# Patient Record
Sex: Male | Born: 2000
Health system: Southern US, Community
[De-identification: ages and names within clinical notes are randomized; demographics above are authoritative.]

## PROBLEM LIST (undated history)

## (undated) HISTORY — PX: OTHER SURGICAL HISTORY: SHX169

---
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
## 2018-02-17 ENCOUNTER — Emergency Department (HOSPITAL_COMMUNITY): Payer: Medicaid Other

## 2018-02-17 ENCOUNTER — Other Ambulatory Visit: Payer: Self-pay

## 2018-02-17 ENCOUNTER — Encounter (HOSPITAL_COMMUNITY): Payer: Self-pay | Admitting: *Deleted

## 2018-02-17 ENCOUNTER — Emergency Department (HOSPITAL_COMMUNITY)
Admission: EM | Admit: 2018-02-17 | Discharge: 2018-02-17 | Disposition: A | Discharge status: Home / Self Care | Payer: Medicaid Other | Attending: Emergency Medicine | Admitting: Emergency Medicine

## 2018-02-17 DIAGNOSIS — Y9389 Activity, other specified: Secondary | ICD-10-CM | POA: Insufficient documentation

## 2018-02-17 DIAGNOSIS — S9031XA Contusion of right foot, initial encounter: Secondary | ICD-10-CM

## 2018-02-17 DIAGNOSIS — W2209XA Striking against other stationary object, initial encounter: Secondary | ICD-10-CM | POA: Diagnosis not present

## 2018-02-17 DIAGNOSIS — Y9289 Other specified places as the place of occurrence of the external cause: Secondary | ICD-10-CM | POA: Insufficient documentation

## 2018-02-17 DIAGNOSIS — Y990 Civilian activity done for income or pay: Secondary | ICD-10-CM | POA: Diagnosis not present

## 2018-02-17 DIAGNOSIS — S99921A Unspecified injury of right foot, initial encounter: Secondary | ICD-10-CM | POA: Diagnosis present

## 2018-02-17 IMAGING — DX DG FOOT COMPLETE 3+V*R*
3 series · 3 of 3 positions shown · non-contrast
Comparison: None.

CLINICAL DATA: Pain and swelling after trauma.

EXAM:
RIGHT FOOT COMPLETE - 3+ VIEW

[foot ap]
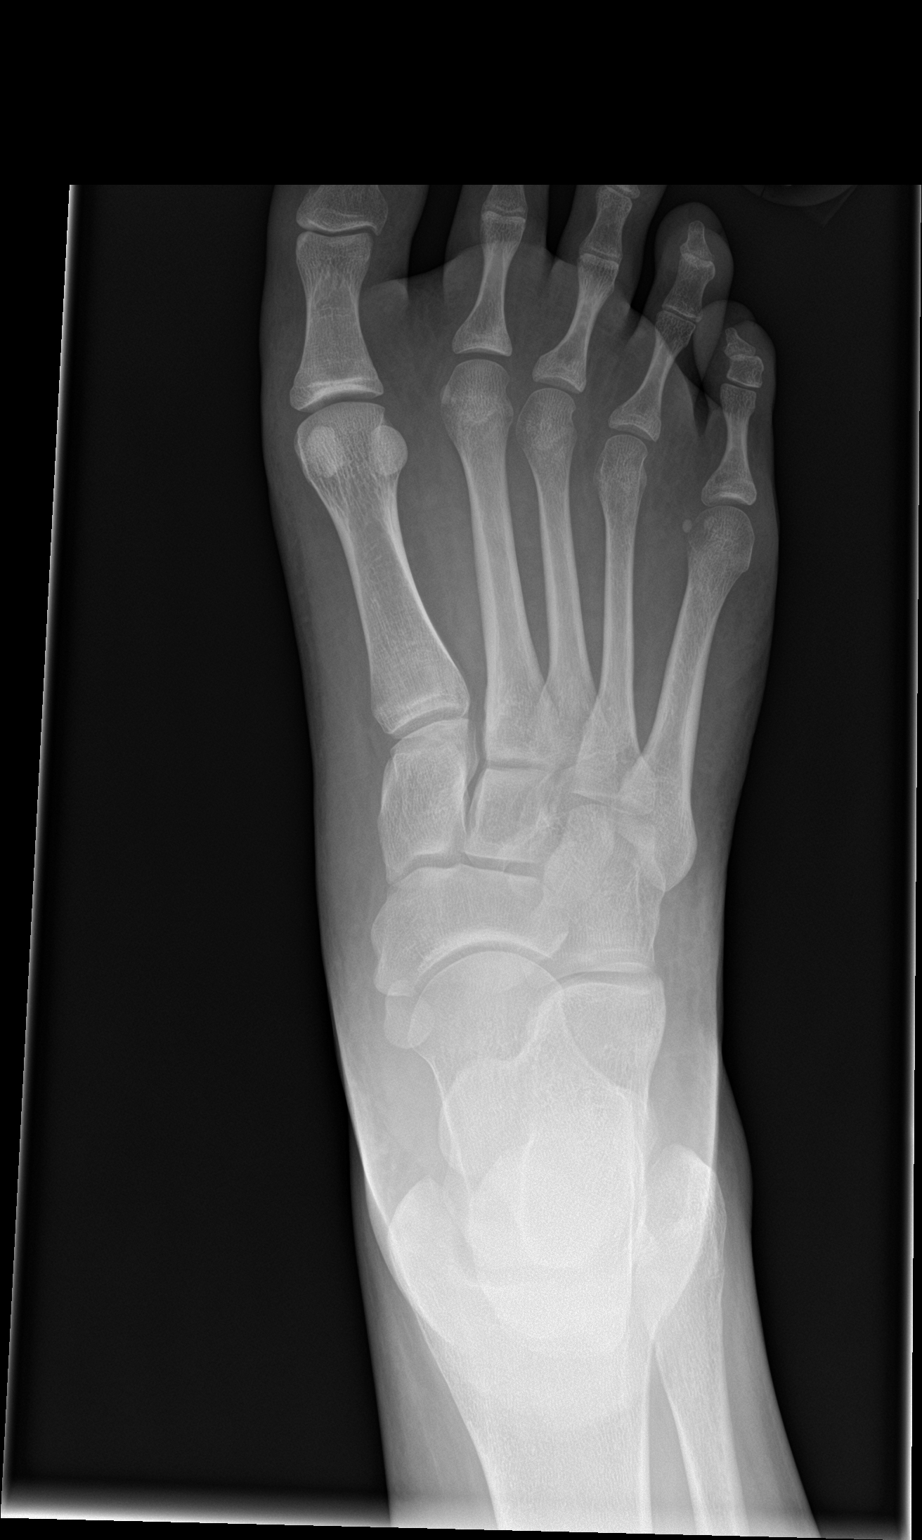

[foot obl]
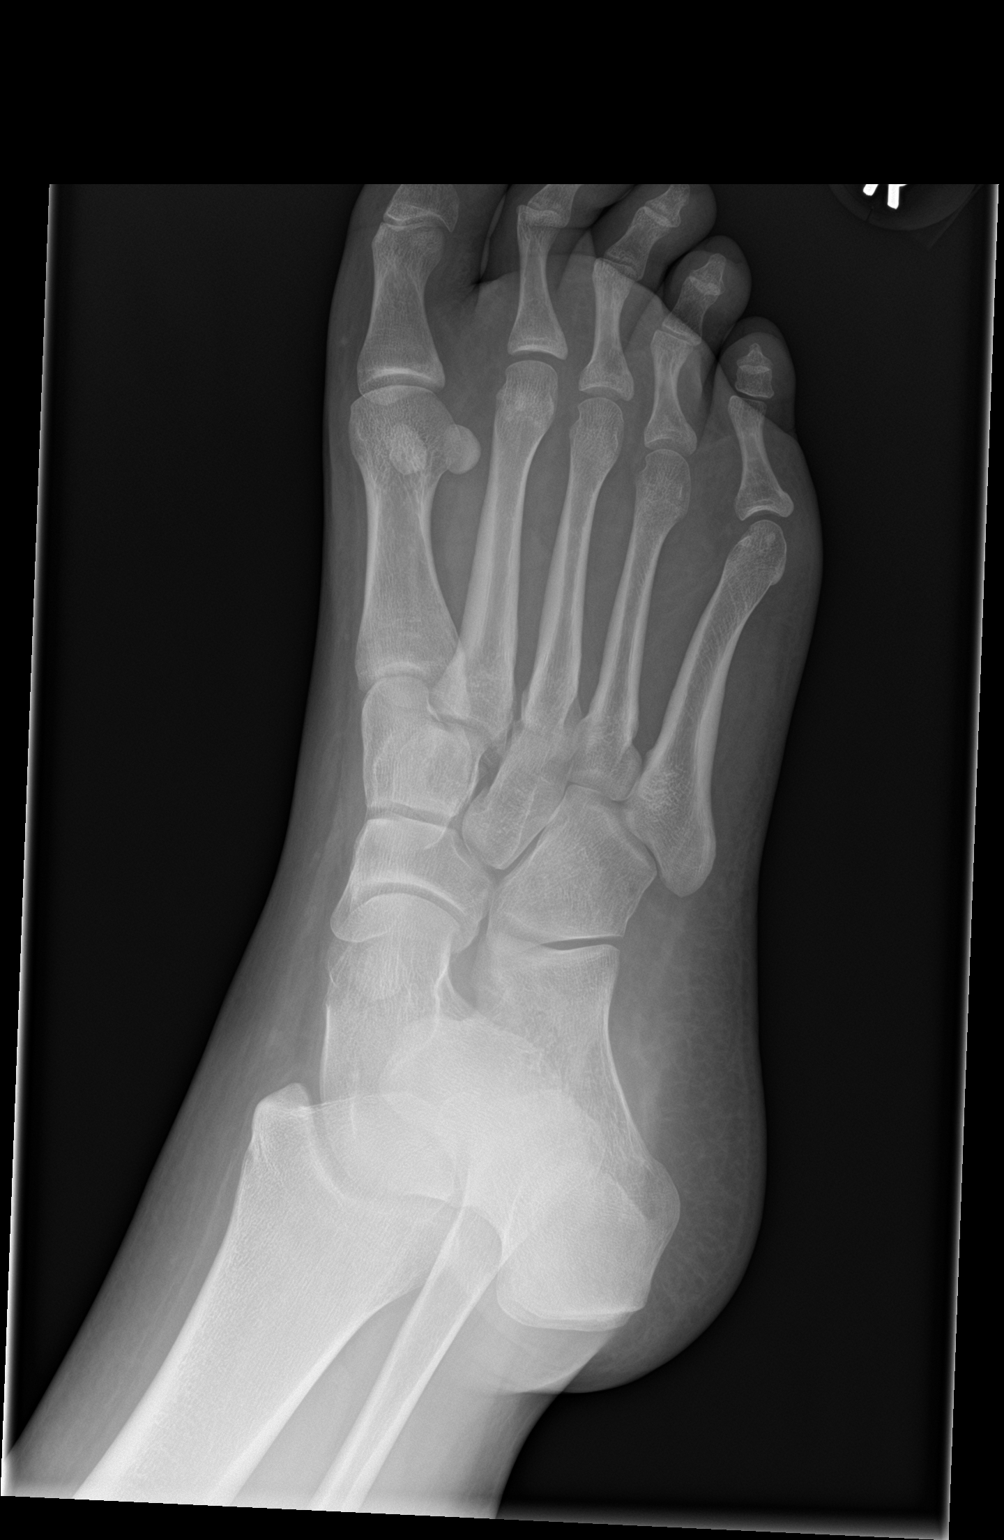

[foot lat]
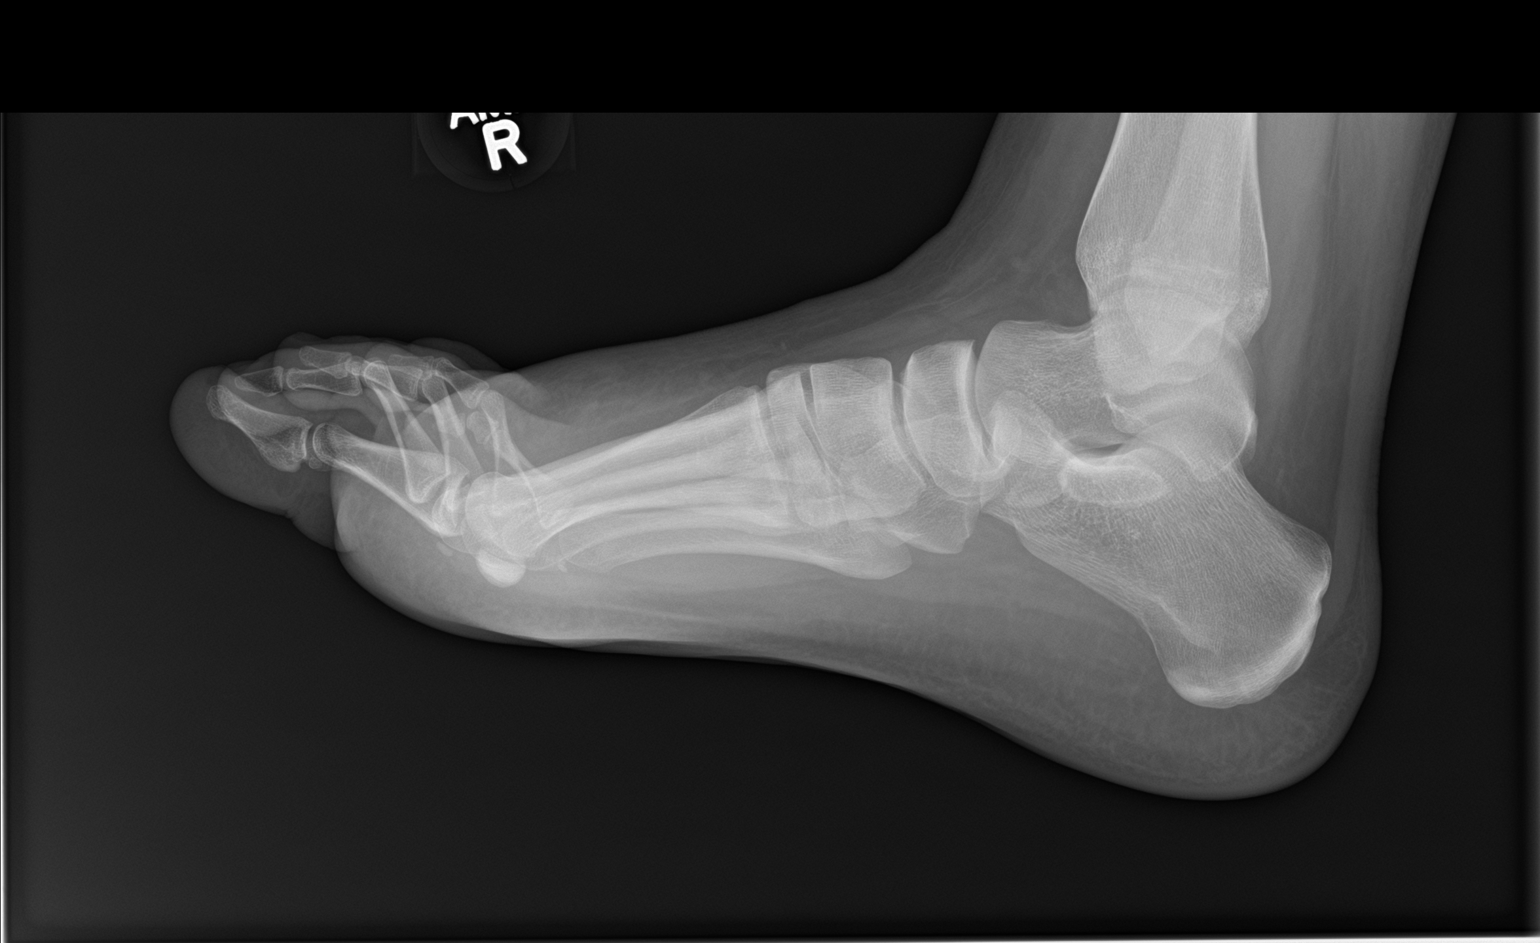

[3 of 3 positions shown; findings below may reference images not displayed]

FINDINGS: There is no evidence of fracture or dislocation. There is no
evidence of arthropathy or other focal bone abnormality. Soft
tissues are unremarkable.
IMPRESSION: Negative.

## 2018-02-17 NOTE — Discharge Instructions (Addendum)
Return if any problems.

## 2018-02-17 NOTE — ED Triage Notes (Signed)
Patient was at work and accidentally kicked a pallet, patient was wearing steel toe boots, injuring right foot visible bruising noted to top of right foot.

## 2018-02-18 NOTE — ED Provider Notes (Signed)
  [facility] EMERGENCY DEPARTMENT Provider Note   CSN: [id number] Arrival date & time: 02/17/18  1120     History   Chief Complaint Chief Complaint  Patient presents with  . Foot Pain    HPI Travis Griffin is a 17 y.o. male.  The history is provided by the patient. No language interpreter was used.  Foot Pain  This is a new problem. The current episode started more than 2 days ago. The problem occurs constantly. The problem has not changed since onset.Nothing aggravates the symptoms. Nothing relieves the symptoms. He has tried nothing for the symptoms. The treatment provided no relief.  Pt complains of soreness in his foot after kicking.  No past medical history on file.  There are no active problems to display for this patient.         Home Medications    Prior to Admission medications   Not on File    Family History No family history on file.  Social History Social History   Tobacco Use  . Smoking status: Never Smoker  . Smokeless tobacco: Never Used  Substance Use Topics  . Alcohol use: Not on file  . Drug use: Not on file     Allergies   Penicillins   Review of Systems Review of Systems  All other systems reviewed and are negative.    Physical Exam Updated Vital Signs BP 132/65 (BP Location: Left Arm)   Pulse 62   Temp 98.9 F (37.2 C) (Oral)   Resp 16   Ht 5\' 11"  (1.803 m)   Wt 108.9 kg (240 lb)   SpO2 100%   BMI 33.47 kg/m   Physical Exam  Constitutional: He appears well-developed and well-nourished.  HENT:  Head: Normocephalic.  Musculoskeletal: He exhibits tenderness. He exhibits no deformity.  Neurological: He is alert.  Skin: Skin is warm.  Psychiatric: He has a normal mood and affect.  Nursing note and vitals reviewed.    ED Treatments / Results  Labs (all labs ordered are listed, but only abnormal results are displayed) Labs Reviewed - No data to display  EKG None  Radiology Dg Foot Complete  Right  Result Date: 02/17/2018 CLINICAL DATA:  Pain and swelling after trauma. EXAM: RIGHT FOOT COMPLETE - 3+ VIEW COMPARISON:  None. FINDINGS: There is no evidence of fracture or dislocation. There is no evidence of arthropathy or other focal bone abnormality. Soft tissues are unremarkable. IMPRESSION: Negative. Electronically Signed   By: Dorise Bullion III M.D   On: 02/17/2018 12:25    Procedures Procedures (including critical care time)  Medications Ordered in ED Medications - No data to display   Initial Impression / Assessment and Plan / ED Course  I have reviewed the triage vital signs and the nursing notes.  Pertinent labs & imaging results that were available during my care of the patient were reviewed by me and considered in my medical decision making (see chart for details).       Final Clinical Impressions(s) / ED Diagnoses   Final diagnoses:  Contusion of right foot, initial encounter    ED Discharge Orders    None    ace wrap   Fransico Meadow, PA-C 02/18/18 7124    Margette Fast, MD 02/18/18 [phone]

## 2018-04-24 ENCOUNTER — Encounter (HOSPITAL_COMMUNITY): Payer: Self-pay | Admitting: Emergency Medicine

## 2018-04-24 ENCOUNTER — Other Ambulatory Visit: Payer: Self-pay

## 2018-04-24 ENCOUNTER — Emergency Department (HOSPITAL_COMMUNITY)
Admission: EM | Admit: 2018-04-24 | Discharge: 2018-04-24 | Disposition: A | Discharge status: Home / Self Care | Payer: Medicaid Other | Attending: Emergency Medicine | Admitting: Emergency Medicine

## 2018-04-24 DIAGNOSIS — R112 Nausea with vomiting, unspecified: Secondary | ICD-10-CM | POA: Diagnosis not present

## 2018-04-24 DIAGNOSIS — R197 Diarrhea, unspecified: Secondary | ICD-10-CM | POA: Diagnosis not present

## 2018-04-24 DIAGNOSIS — R740 Nonspecific elevation of levels of transaminase and lactic acid dehydrogenase [LDH]: Secondary | ICD-10-CM | POA: Insufficient documentation

## 2018-04-24 DIAGNOSIS — R7401 Elevation of levels of liver transaminase levels: Secondary | ICD-10-CM

## 2018-04-24 LAB — COMPREHENSIVE METABOLIC PANEL
ALK PHOS: 43 U/L (ref 38–126)
ALT: 76 U/L — AB (ref 0–44)
AST: 77 U/L — AB (ref 15–41)
Albumin: 4.4 g/dL (ref 3.5–5.0)
Anion gap: 10 (ref 5–15)
BILIRUBIN TOTAL: 1.4 mg/dL — AB (ref 0.3–1.2)
BUN: 11 mg/dL (ref 6–20)
CALCIUM: 9.5 mg/dL (ref 8.9–10.3)
CHLORIDE: 103 mmol/L (ref 98–111)
CO2: 25 mmol/L (ref 22–32)
CREATININE: 0.78 mg/dL (ref 0.61–1.24)
GFR calc non Af Amer: >60 mL/min (ref >60)
Glucose, Bld: 96 mg/dL (ref 70–99)
Potassium: 3.7 mmol/L (ref 3.5–5.1)
Sodium: 138 mmol/L (ref 135–145)
TOTAL PROTEIN: 8.2 g/dL — AB (ref 6.5–8.1)

## 2018-04-24 LAB — URINALYSIS, ROUTINE W REFLEX MICROSCOPIC
Bilirubin Urine: NEGATIVE
GLUCOSE, UA: NEGATIVE mg/dL
Hgb urine dipstick: NEGATIVE
KETONES UR: NEGATIVE mg/dL
LEUKOCYTES UA: NEGATIVE
Nitrite: NEGATIVE
PROTEIN: NEGATIVE mg/dL
Specific Gravity, Urine: 1.021 (ref 1.005–1.030)
pH: 5 (ref 5.0–8.0)

## 2018-04-24 LAB — CBC
HCT: 41.8 % (ref 39.0–52.0)
Hemoglobin: 14.1 g/dL (ref 13.0–17.0)
MCH: 29 pg (ref 26.0–34.0)
MCHC: 33.7 g/dL (ref 30.0–36.0)
MCV: 86 fL (ref 78.0–100.0)
PLATELETS: 304 10*3/uL (ref 150–400)
RBC: 4.86 MIL/uL (ref 4.22–5.81)
RDW: 13 % (ref 11.5–15.5)
WBC: 8.5 10*3/uL (ref 4.0–10.5)

## 2018-04-24 LAB — LIPASE, BLOOD: Lipase: 23 U/L (ref 11–51)

## 2018-04-24 MED ORDER — ONDANSETRON HCL 4 MG PO TABS: 4.0000 mg | ORAL_TABLET | Freq: Three times a day (TID) | ORAL | 0 refills | Status: AC | PRN

## 2018-04-24 NOTE — ED Provider Notes (Signed)
[facility] EMERGENCY DEPARTMENT Provider Note   CSN: [id number] Arrival date & time: 04/24/18  1344     History   Chief Complaint Chief Complaint  Patient presents with  . Abdominal Pain    HPI Travis Griffin is a 17 y.o. male.  17 y.o male with no PMH presents to the ED complaining of nausea, lightheadedness and blurry vision since this morning.Patient was seen at [facility] and diagnosed with viral gastroenteritis and prescribed lamotol. Patient states today he was at work when he began to feel lightheaded and nauseated.Patient states he has not been able to eat much lately, he states he's eaten a [place] sandwich and a chicken biscuit in the past two days. Patient states he experienced discomfort when "my food hits my stomach". Patient denies any vomiting, diarrhea, abdominal pain or chest pain/shortness of breath.      History reviewed. No pertinent past medical history.  There are no active problems to display for this patient.   History reviewed. No pertinent surgical history.      Home Medications    Prior to Admission medications   Medication Sig Start Date End Date Taking? Authorizing Provider  diphenoxylate-atropine (LOMOTIL) 2.5-0.025 MG tablet Take 2 tablets by mouth daily. 04/21/18  Yes [provider]  naproxen (NAPROSYN) 250 MG tablet Take 2 mg by mouth daily as needed for moderate pain or headache.   Yes [provider]  ondansetron (ZOFRAN) 4 MG tablet Take 1 tablet (4 mg total) by mouth every 8 (eight) hours as needed for up to 7 days for nausea or vomiting. 04/24/18 05/01/18  Janeece Fitting, PA-C    Family History No family history on file.  Social History Social History   Tobacco Use  . Smoking status: Never Smoker  . Smokeless tobacco: Never Used  Substance Use Topics  . Alcohol use: Never    Frequency: Never  . Drug use: Never     Allergies   Milk-related compounds and Penicillins   Review of Systems Review of Systems    Constitutional: Negative for chills and fever.  HENT: Negative for sore throat.   Eyes: Negative for photophobia.  Respiratory: Negative for chest tightness and shortness of breath.   Cardiovascular: Negative for chest pain and palpitations.  Gastrointestinal: Positive for nausea. Negative for abdominal pain (discomfort and pain upon palpation), diarrhea and vomiting.  Genitourinary: Negative for dysuria, flank pain and hematuria.  Musculoskeletal: Negative for back pain.  Neurological: Positive for light-headedness.  All other systems reviewed and are negative.    Physical Exam Updated Vital Signs BP 137/88 (BP Location: Right Arm)   Pulse 82   Temp 98.8 F (37.1 C) (Oral)   Resp 20   Ht 5\' 11"  (1.803 m)   Wt 113.4 kg (250 lb)   BMI 34.87 kg/m   Physical Exam  Constitutional: He is oriented to person, place, and time. He appears well-developed and well-nourished.  HENT:  Head: Normocephalic and atraumatic.  Mouth/Throat: Oropharynx is clear and moist.  Eyes: Pupils are equal, round, and reactive to light. No scleral icterus. Right pupil is reactive. Left pupil is reactive. Pupils are equal.  Neck: Normal range of motion.  Cardiovascular: Normal heart sounds.  Pulmonary/Chest: Effort normal and breath sounds normal. He has no wheezes. He exhibits no tenderness.  Abdominal: Soft. He exhibits no distension. Bowel sounds are decreased. There is tenderness in the right upper quadrant. There is no guarding, no CVA tenderness and no tenderness at McBurney's point.  Musculoskeletal:  He exhibits no tenderness or deformity.  Neurological: He is alert and oriented to person, place, and time. He has normal strength. No cranial nerve deficit.  Skin: Skin is warm and dry.  Nursing note and vitals reviewed.    ED Treatments / Results  Labs (all labs ordered are listed, but only abnormal results are displayed) Labs Reviewed  COMPREHENSIVE METABOLIC PANEL - Abnormal; Notable for the  following components:      Result Value   Total Protein 8.2 (*)    AST 77 (*)    ALT 76 (*)    Total Bilirubin 1.4 (*)    All other components within normal limits  URINALYSIS, ROUTINE W REFLEX MICROSCOPIC - Abnormal; Notable for the following components:   APPearance CLOUDY (*)    All other components within normal limits  LIPASE, BLOOD  CBC  HEPATITIS PANEL, ACUTE    EKG None  Radiology No results found.  Procedures Procedures (including critical care time)  Medications Ordered in ED Medications - No data to display   Initial Impression / Assessment and Plan / ED Course  I have reviewed the triage vital signs and the nursing notes.  Pertinent labs & imaging results that were available during my care of the patient were reviewed by me and considered in my medical decision making (see chart for details).    Patient presents to the ED complaining of nausea lightheadedness and blurry vision.Patient was seen at [facility] Tuesday and diagnosed with a viral gastroenteritis. Patient was prescribed lomotil and states the diarrhea has stopped.   Upon examination patient states he has some tenderness on the RUQ. Lab results showed: elevated liver enzymes.I have added an acute hepatitis panel and advised patient to follow up with PCP. Returned precautions provided to patient. Dr. Sabra Heck has discussed and seeing this patient with me. At this time patient is stable for discharge.   Final Clinical Impressions(s) / ED Diagnoses   Final diagnoses:  Transaminitis  Nausea vomiting and diarrhea    ED Discharge Orders        Ordered    ondansetron (ZOFRAN) 4 MG tablet  Every 8 hours PRN     04/24/18 1522       Janeece Fitting, PA-C 04/24/18 1533    Noemi Chapel, MD 04/25/18 [phone]

## 2018-04-24 NOTE — ED Triage Notes (Signed)
Pt c/o diarrhea, abdominal cramping, and lightheadedness that began Monday. Pt went to UC and diagnosed with GI virus. Pt given lomotil for diarrhea. Symptoms improved until today when he went back to work. Pt had to leave early due to generalized weakness, nausea, and lightheadedness.

## 2018-04-24 NOTE — Discharge Instructions (Addendum)
Please follow up with PCP on hepatitis panel. Please continue to hydrate.I have prescribed nausea medication, please take as needed.

## 2018-04-24 NOTE — ED Provider Notes (Signed)
Patient is a slightly overweight 17 year old male, very healthy-appearing, no significant chronic medical history, presents after approximately 5 days of nausea vomiting and diarrhea.  His last meal was 2 days ago, he has had very little since then, was getting lightheaded with standing at work.  On exam he has a soft nontender abdomen, bowel sounds are decreased, no distention, no edema, normal heart and lung sounds.  The patient is awake alert, he is aware that he needs to drink more fluids, I will encourage him to have 2 days of rest and oral hydration at home.    He will be given nausea medication for home, he will need to follow-up the hepatitis panel at home.  Pt agreeable.  Medical screening examination/treatment/procedure(s) were conducted as a shared visit with non-physician practitioner(s) and myself.  I personally evaluated the patient during the encounter.  Clinical Impression:   Final diagnoses:  Transaminitis  Nausea vomiting and diarrhea         Noemi Chapel, MD 04/25/18 [phone]

## 2018-04-25 LAB — HEPATITIS PANEL, ACUTE
HEP B C IGM: NEGATIVE
HEP B S AG: NEGATIVE
Hep A IgM: NEGATIVE

## 2018-04-29 NOTE — ED Notes (Signed)
Pt called requesting results of blood work.  Verified pt and consulted with Dr. Thurnell Garbe.  Notified pt hepatitis panel was negative but needed to follow up with primary doctor.  Pt verbalized understanding.

## 2018-05-10 ENCOUNTER — Emergency Department (HOSPITAL_COMMUNITY)
Admission: EM | Admit: 2018-05-10 | Discharge: 2018-05-10 | Disposition: A | Discharge status: Home / Self Care | Payer: Medicaid Other | Attending: Emergency Medicine | Admitting: Emergency Medicine

## 2018-05-10 ENCOUNTER — Encounter (HOSPITAL_COMMUNITY): Payer: Self-pay | Admitting: Emergency Medicine

## 2018-05-10 ENCOUNTER — Other Ambulatory Visit: Payer: Self-pay

## 2018-05-10 DIAGNOSIS — Y998 Other external cause status: Secondary | ICD-10-CM | POA: Insufficient documentation

## 2018-05-10 DIAGNOSIS — Y9389 Activity, other specified: Secondary | ICD-10-CM | POA: Diagnosis not present

## 2018-05-10 DIAGNOSIS — Y929 Unspecified place or not applicable: Secondary | ICD-10-CM | POA: Diagnosis not present

## 2018-05-10 DIAGNOSIS — W260XXA Contact with knife, initial encounter: Secondary | ICD-10-CM | POA: Insufficient documentation

## 2018-05-10 DIAGNOSIS — S6992XA Unspecified injury of left wrist, hand and finger(s), initial encounter: Secondary | ICD-10-CM | POA: Diagnosis present

## 2018-05-10 DIAGNOSIS — S61012A Laceration without foreign body of left thumb without damage to nail, initial encounter: Secondary | ICD-10-CM | POA: Diagnosis not present

## 2018-05-10 MED ORDER — BACITRACIN ZINC 500 UNIT/GM EX OINT
TOPICAL_OINTMENT | CUTANEOUS | Status: AC
  Administered 2018-05-10: 1 via TOPICAL
  Filled 2018-05-10: qty 0.9

## 2018-05-10 MED ORDER — BACITRACIN ZINC 500 UNIT/GM EX OINT
1.0000 "application " | TOPICAL_OINTMENT | Freq: Two times a day (BID) | CUTANEOUS | Status: DC
  Administered 2018-05-10: 1 via TOPICAL

## 2018-05-10 MED ORDER — IBUPROFEN 400 MG PO TABS: 400.0000 mg | ORAL_TABLET | Freq: Four times a day (QID) | ORAL | 0 refills | Status: DC | PRN

## 2018-05-10 MED ORDER — LIDOCAINE-EPINEPHRINE (PF) 1 %-1:200000 IJ SOLN
INTRAMUSCULAR | Status: AC
  Administered 2018-05-10: 30 mL
  Filled 2018-05-10: qty 30

## 2018-05-10 NOTE — ED Notes (Signed)
Left hand, medial thumb laceration, still bleeding, sub Q tissue visible. Pressure dressing applied--ABD pad applied and wrapped tightly with Coban. Pt instructed to elevate.

## 2018-05-10 NOTE — Discharge Instructions (Signed)

## 2018-05-10 NOTE — ED Provider Notes (Signed)
[facility] EMERGENCY DEPARTMENT Provider Note   CSN: [id number] Arrival date & time: 05/10/18  1946     History   Chief Complaint Chief Complaint  Patient presents with  . Laceration    HPI Travis Griffin is a 17 y.o. male.  HPI  Otherwise healthy 17 year old male presents with acute onset of a laceration to his left thumb.  This occurred just prior to arrival when he was trying to fix his pocket knife, it slipped causing a straight incised wound of his left thumb with excessive amounts of bleeding.  He has pain which is worse with palpation, persistent, mild, not associated with lightheadedness or any numbness.  He believes that he is up-to-date on tetanus.  History reviewed. No pertinent past medical history.  There are no active problems to display for this patient.   History reviewed. No pertinent surgical history.      Home Medications    Prior to Admission medications   Medication Sig Start Date End Date Taking? Authorizing Provider  ibuprofen (ADVIL,MOTRIN) 400 MG tablet Take 1 tablet (400 mg total) by mouth every 6 (six) hours as needed. 05/10/18   Noemi Chapel, MD    Family History No family history on file.  Social History Social History   Tobacco Use  . Smoking status: Never Smoker  . Smokeless tobacco: Never Used  Substance Use Topics  . Alcohol use: Never    Frequency: Never  . Drug use: Never     Allergies   Milk-related compounds and Penicillins   Review of Systems Review of Systems  Constitutional: Negative for fever.  Gastrointestinal: Negative for vomiting.  Skin: Positive for wound.       Laceration  Neurological: Negative for weakness and numbness.     Physical Exam Updated Vital Signs BP 122/79 (BP Location: Right Arm)   Pulse 64   Temp 98.4 F (36.9 C) (Oral)   Resp 18   Ht 5\' 11"  (1.803 m)   Wt 108.9 kg (240 lb)   SpO2 98%   BMI 33.47 kg/m   Physical Exam  Constitutional: He appears well-developed and  well-nourished. No distress.  HENT:  Head: Normocephalic.  Eyes: Conjunctivae are normal. No scleral icterus.  Cardiovascular: Normal rate and regular rhythm.  Pulmonary/Chest: Effort normal and breath sounds normal.  Musculoskeletal: Normal range of motion. He exhibits tenderness ( ttp over the laceration site). He exhibits no edema.  Neurological: He is alert. Coordination normal.  Sensation and motor intact  Skin: Skin is warm and dry. He is not diaphoretic.  Laceration located on left thumb  the Laceration is linear shaped The depth is 2 mm The length is 3 cm     ED Treatments / Results  Labs (all labs ordered are listed, but only abnormal results are displayed) Labs Reviewed - No data to display  EKG None  Radiology No results found.  Procedures .Marland KitchenLaceration Repair Date/Time: 05/10/2018 8:20 PM Performed by: Noemi Chapel, MD Authorized by: Noemi Chapel, MD   Consent:    Consent obtained:  Verbal   Consent given by:  Patient   Risks discussed:  Infection, pain, need for additional repair, poor cosmetic result and poor wound healing   Alternatives discussed:  No treatment and delayed treatment Anesthesia (see MAR for exact dosages):    Anesthesia method:  Local infiltration   Local anesthetic:  Lidocaine 1% WITH epi Laceration details:    Location:  Finger   Finger location:  L thumb   Length (  cm):  3   Depth (mm):  2 Repair type:    Repair type:  Simple Pre-procedure details:    Preparation:  Patient was prepped and draped in usual sterile fashion and imaging obtained to evaluate for foreign bodies (The wound was explored, there was no signs of foreign body, no violation of the joint capsule, no exposed bone, no exposed tendons, normal range of motion in all directions against resistance) Exploration:    Hemostasis achieved with:  Direct pressure   Wound exploration: wound explored through full range of motion and entire depth of wound probed and visualized       Wound extent: no fascia violation noted, no foreign bodies/material noted, no muscle damage noted, no nerve damage noted, no tendon damage noted, no underlying fracture noted and no vascular damage noted     Contaminated: no   Treatment:    Area cleansed with:  Betadine   Amount of cleaning:  Standard   Irrigation solution:  Sterile saline   Irrigation volume:  1000 mL   Irrigation method:  Syringe Skin repair:    Repair method:  Sutures   Suture size:  4-0   Suture material:  Prolene   Suture technique:  Simple interrupted   Number of sutures:  4 Approximation:    Approximation:  Close Post-procedure details:    Dressing:  Antibiotic ointment and sterile dressing   Patient tolerance of procedure:  Tolerated well, no immediate complications Comments:         (including critical care time)  Medications Ordered in ED Medications  lidocaine-EPINEPHrine (XYLOCAINE-EPINEPHrine) 1 %-1:200000 (PF) injection (has no administration in time range)  bacitracin ointment 1 application (has no administration in time range)     Initial Impression / Assessment and Plan / ED Course  I have reviewed the triage vital signs and the nursing notes.  Pertinent labs & imaging results that were available during my care of the patient were reviewed by me and considered in my medical decision making (see chart for details).     Wound repaired, tetanus up-to-date, stable for discharge, dressing placed, static finger splint placed, patient given instructions on return, expressed understanding.  The patient is neurovascularly intact with normal flexor and extensor tendon function, normal sensation to the tip of the thumb  Final Clinical Impressions(s) / ED Diagnoses   Final diagnoses:  Laceration of left thumb without foreign body without damage to nail, initial encounter    ED Discharge Orders        Ordered    ibuprofen (ADVIL,MOTRIN) 400 MG tablet  Every 6 hours PRN     05/10/18 2017        Noemi Chapel, MD 05/10/18 2021

## 2018-05-10 NOTE — ED Triage Notes (Signed)
Pt states he was "messing with " a pocket knife when he cut his L thumb. Bleeding not controlled at this time. Pressure bandage applied.

## 2018-05-10 NOTE — ED Notes (Signed)
EDP in room  

## 2019-09-08 ENCOUNTER — Other Ambulatory Visit: Payer: Self-pay

## 2019-09-08 DIAGNOSIS — Z20822 Contact with and (suspected) exposure to covid-19: Secondary | ICD-10-CM

## 2019-09-10 LAB — NOVEL CORONAVIRUS, NAA: SARS-CoV-2, NAA: NOT DETECTED

## 2020-08-25 DIAGNOSIS — H5213 Myopia, bilateral: Secondary | ICD-10-CM | POA: Diagnosis not present

## 2020-08-25 DIAGNOSIS — H52223 Regular astigmatism, bilateral: Secondary | ICD-10-CM | POA: Diagnosis not present

## 2020-10-21 ENCOUNTER — Other Ambulatory Visit: Payer: Self-pay

## 2020-10-21 ENCOUNTER — Ambulatory Visit
Admission: EM | Admit: 2020-10-21 | Discharge: 2020-10-21 | Disposition: A | Discharge status: Home / Self Care | Payer: Medicaid Other | Attending: Family Medicine | Admitting: Family Medicine

## 2020-10-21 DIAGNOSIS — R0602 Shortness of breath: Secondary | ICD-10-CM

## 2020-10-21 DIAGNOSIS — J029 Acute pharyngitis, unspecified: Secondary | ICD-10-CM

## 2020-10-21 DIAGNOSIS — Z1152 Encounter for screening for COVID-19: Secondary | ICD-10-CM

## 2020-10-21 DIAGNOSIS — R059 Cough, unspecified: Secondary | ICD-10-CM

## 2020-10-21 DIAGNOSIS — J3489 Other specified disorders of nose and nasal sinuses: Secondary | ICD-10-CM

## 2020-10-21 DIAGNOSIS — R0981 Nasal congestion: Secondary | ICD-10-CM

## 2020-10-21 DIAGNOSIS — B349 Viral infection, unspecified: Secondary | ICD-10-CM

## 2020-10-21 MED ORDER — PROMETHAZINE-DM 6.25-15 MG/5ML PO SYRP: 5.0000 mL | ORAL_SOLUTION | Freq: Four times a day (QID) | ORAL | 0 refills | Status: DC | PRN

## 2020-10-21 NOTE — Discharge Instructions (Signed)
I have sent in cough syrup for you to take. This medication can make you sleepy. Do not drive while taking this medication.  Your COVID and Flu tests are pending.  You should self quarantine until the test results are back.    Take Tylenol or ibuprofen as needed for fever or discomfort.  Rest and keep yourself hydrated.    Follow-up with your primary care provider if your symptoms are not improving.

## 2020-10-21 NOTE — ED Provider Notes (Signed)
[facility]   [id number] 10/21/20 Arrival Time: 1209   CC: COVID symptoms  SUBJECTIVE: History from: patient.  Travis Griffin is a 20 y.o. male who presents with nasal congestion, sore throat and cough x 3 days. Denies sick exposure to COVID, flu or strep. Denies recent travel. Has negative history of Covid. Has not completed Covid vaccines. Has not taken OTC medications for this. Sore throat is worse with swallowing Denies previous symptoms in the past. Denies fever, chills, fatigue, sinus pain, rhinorrhea, SOB, wheezing, chest pain, nausea, changes in bowel or bladder habits.    ROS: As per HPI.  All other pertinent ROS negative.     History reviewed. No pertinent past medical history. History reviewed. No pertinent surgical history. Allergies  Allergen Reactions  . Amoxicillin   . Milk-Related Compounds Diarrhea    Stomach cramps with milk and ice-cream  . Penicillins Hives    Has patient had a PCN reaction causing immediate rash, facial/tongue/throat swelling, SOB or lightheadedness with hypotension: Unknown Has patient had a PCN reaction causing severe rash involving mucus membranes or skin necrosis: Unknown Has patient had a PCN reaction that required hospitalization: Unknown Has patient had a PCN reaction occurring within the last 10 years: Unknown If all of the above answers are "NO", then may proceed with Cephalosporin use.    No current facility-administered medications on file prior to encounter.   Current Outpatient Medications on File Prior to Encounter  Medication Sig Dispense Refill  . ibuprofen (ADVIL,MOTRIN) 400 MG tablet Take 1 tablet (400 mg total) by mouth every 6 (six) hours as needed. 30 tablet 0   Social History   Socioeconomic History  . Marital status: Single    Spouse name: Not on file  . Number of children: Not on file  . Years of education: Not on file  . Highest education level: Not on file  Occupational History  . Not on file   Tobacco Use  . Smoking status: Never Smoker  . Smokeless tobacco: Never Used  Vaping Use  . Vaping Use: Never used  Substance and Sexual Activity  . Alcohol use: Never  . Drug use: Never  . Sexual activity: Not on file  Other Topics Concern  . Not on file  Social History Narrative  . Not on file   Social Determinants of Health   Financial Resource Strain: Not on file  Food Insecurity: Not on file  Transportation Needs: Not on file  Physical Activity: Not on file  Stress: Not on file  Social Connections: Not on file  Intimate Partner Violence: Not on file   History reviewed. No pertinent family history.  OBJECTIVE:  Vitals:   10/21/20 1319  BP: 132/86  Pulse: 79  Resp: 16  Temp: 98.4 F (36.9 C)  TempSrc: Oral  SpO2: 97%     General appearance: alert; appears fatigued, but nontoxic; speaking in full sentences and tolerating own secretions HEENT: NCAT; Ears: EACs clear, TMs pearly gray; Eyes: PERRL.  EOM grossly intact. Sinuses: nontender; Nose: nares patent with clear rhinorrhea, Throat: oropharynx erythematous, cobblestoning present, tonsils non erythematous or enlarged, uvula midline  Neck: supple without LAD Lungs: unlabored respirations, symmetrical air entry; cough: absent; no respiratory distress; CTAB Heart: regular rate and rhythm.  Radial pulses 2+ symmetrical bilaterally Skin: warm and dry Psychological: alert and cooperative; normal mood and affect  LABS:  No results found for this or any previous visit (from the past 24 hour(s)).   ASSESSMENT & PLAN:  1. Viral illness   2. Encounter for screening for COVID-19   3. Sore throat   4. Rhinorrhea   5. Nasal congestion   6. Cough   7. SOB (shortness of breath)     Meds ordered this encounter  Medications  . promethazine-dextromethorphan (PROMETHAZINE-DM) 6.25-15 MG/5ML syrup    Sig: Take 5 mLs by mouth 4 (four) times daily as needed for cough.    Dispense:  118 mL    Refill:  0    Order  Specific Question:   Supervising Provider    Answer:   Chase Picket [[id number]]   Promethazine cough syrup prescribed Sedation precautions given Continue supportive care at home COVID and flu testing ordered.  It will take between 2-3 days for test results. Someone will contact you regarding abnormal results.   Work note provided Patient should remain in quarantine until they have received Covid results.  If negative you may resume normal activities (go back to work/school) while practicing hand hygiene, social distance, and mask wearing.  If positive, patient should remain in quarantine for at least 5 days from symptom onset AND greater than 72 hours after symptoms resolution (absence of fever without the use of fever-reducing medication and improvement in respiratory symptoms), whichever is longer Get plenty of rest and push fluids Use OTC zyrtec for nasal congestion, runny nose, and/or sore throat Use OTC flonase for nasal congestion and runny nose Use medications daily for symptom relief Use OTC medications like ibuprofen or tylenol as needed fever or pain Call or go to the ED if you have any new or worsening symptoms such as fever, worsening cough, shortness of breath, chest tightness, chest pain, turning blue, changes in mental status.  Reviewed expectations re: course of current medical issues. Questions answered. Outlined signs and symptoms indicating need for more acute intervention. Patient verbalized understanding. After Visit Summary given.         Faustino Congress, NP 10/23/20 2025

## 2020-10-21 NOTE — ED Triage Notes (Signed)
Pt presents with c/o sore throat , nasal congestion and cough

## 2020-10-24 LAB — COVID-19, FLU A+B NAA
Influenza A, NAA: NOT DETECTED
Influenza B, NAA: NOT DETECTED
SARS-CoV-2, NAA: NOT DETECTED

## 2020-11-17 DIAGNOSIS — J029 Acute pharyngitis, unspecified: Secondary | ICD-10-CM | POA: Diagnosis not present

## 2020-11-17 DIAGNOSIS — R0982 Postnasal drip: Secondary | ICD-10-CM | POA: Diagnosis not present

## 2020-11-17 DIAGNOSIS — J302 Other seasonal allergic rhinitis: Secondary | ICD-10-CM | POA: Diagnosis not present

## 2021-02-14 DIAGNOSIS — H539 Unspecified visual disturbance: Secondary | ICD-10-CM | POA: Diagnosis not present

## 2021-02-28 DIAGNOSIS — H469 Unspecified optic neuritis: Secondary | ICD-10-CM | POA: Diagnosis not present

## 2021-03-06 DIAGNOSIS — U071 COVID-19: Secondary | ICD-10-CM | POA: Diagnosis not present

## 2021-03-13 ENCOUNTER — Other Ambulatory Visit: Payer: Self-pay | Admitting: Ophthalmology

## 2021-03-13 DIAGNOSIS — H5340 Unspecified visual field defects: Secondary | ICD-10-CM

## 2021-03-13 DIAGNOSIS — H469 Unspecified optic neuritis: Secondary | ICD-10-CM

## 2021-04-06 ENCOUNTER — Other Ambulatory Visit: Payer: Self-pay

## 2021-04-06 ENCOUNTER — Ambulatory Visit
Admission: RE | Admit: 2021-04-06 | Discharge: 2021-04-06 | Disposition: A | Discharge status: Home / Self Care | Payer: BC Managed Care – PPO | Source: Ambulatory Visit | Attending: Ophthalmology | Admitting: Ophthalmology

## 2021-04-06 DIAGNOSIS — H469 Unspecified optic neuritis: Secondary | ICD-10-CM

## 2021-04-06 DIAGNOSIS — G9389 Other specified disorders of brain: Secondary | ICD-10-CM | POA: Diagnosis not present

## 2021-04-06 DIAGNOSIS — H538 Other visual disturbances: Secondary | ICD-10-CM | POA: Diagnosis not present

## 2021-04-06 DIAGNOSIS — H5340 Unspecified visual field defects: Secondary | ICD-10-CM

## 2021-04-06 IMAGING — MR MR HEAD WO/W CM
13 of 14 series · 38 of 48 positions shown · IV contrast (multihance)
Comparison: None.
COMPARISON: None.

Addendum:
CLINICAL DATA: 19-year-old male with visual defect. Right eye
blurred vision x5 years.

EXAM:
MRI HEAD WITHOUT AND WITH CONTRAST
TECHNIQUE: Multiplanar, multiecho pulse sequences of the brain and surrounding
structures were obtained without and with intravenous contrast.
CONTRAST:  20mL MULTIHANCE GADOBENATE DIMEGLUMINE 529 MG/ML IV SOLN

[Series 2: T1 · sagittal · 5.0mm · 0.46mm/px · 2 of 22 slices shown]
[im 1/22]
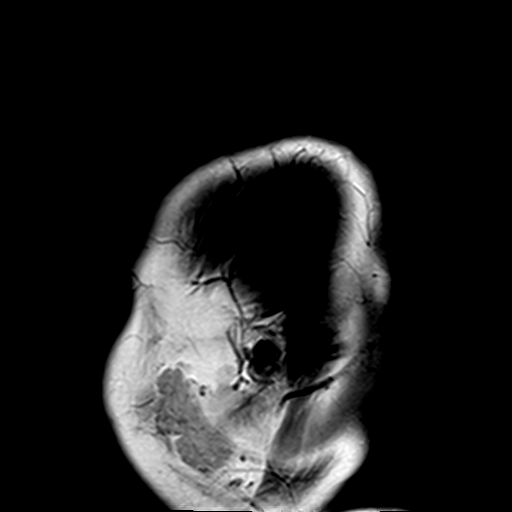
[im 22/22]
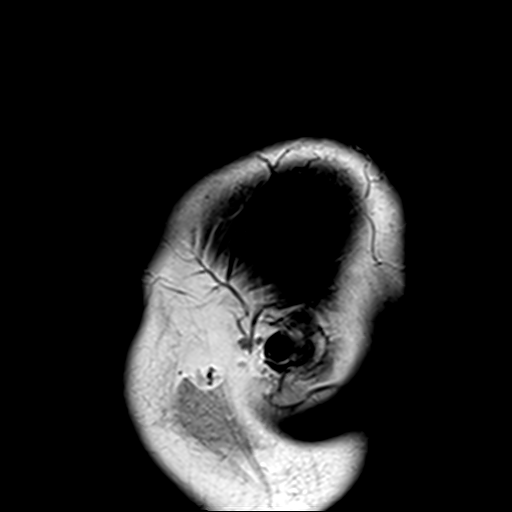

[Series 3: DWI · axial · 3.0mm · 1.80mm/px · z∈[-56,+91]mm · 6 of 99 slices shown (1 of 4)]
[im 1/99]
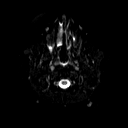
[im 20/99]
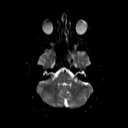
[im 40/99]
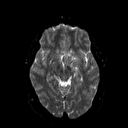
[im 59/99]
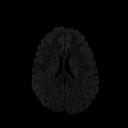
[im 79/99]
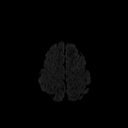
[im 99/99]
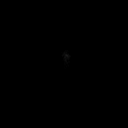

[Series 4: DWI · axial · 3.0mm · 1.80mm/px · z∈[-56,+91]mm · 3 of 50 slices shown (2 of 4)]
[im 1/50]
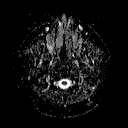
[im 25/50]
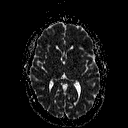
[im 50/50]
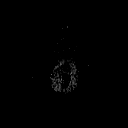

[Series 5: DWI · coronal · 5.0mm · 1.80mm/px · 4 of 72 slices shown (3 of 4)]
[im 1/72]
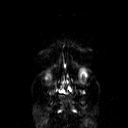
[im 24/72]
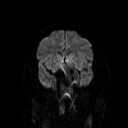
[im 48/72]
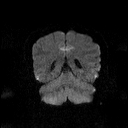
[im 72/72]
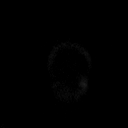

[Series 6: DWI · coronal · 5.0mm · 1.80mm/px · 2 of 36 slices shown (4 of 4)]
[im 1/36]
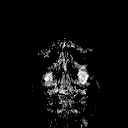
[im 36/36]
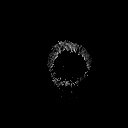

[Series 7: T2 · axial · 5.0mm · 0.60mm/px · 1 of 22 slices shown (1 of 2)]
[im 1/22]
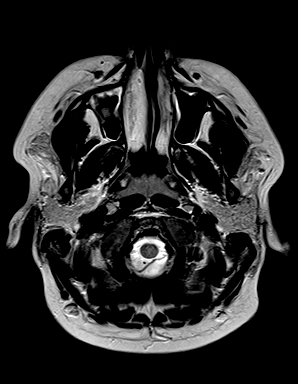

[Series 8: FLAIR · axial · 3.0mm · 0.45mm/px · z∈[-59,+90]mm · 2 of 33 slices shown]
[im 1/33]
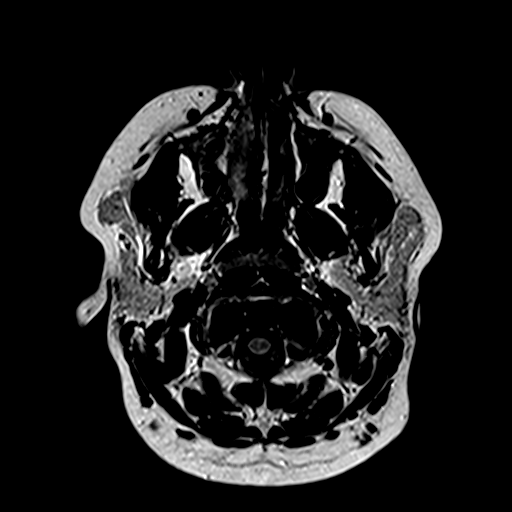
[im 33/33]
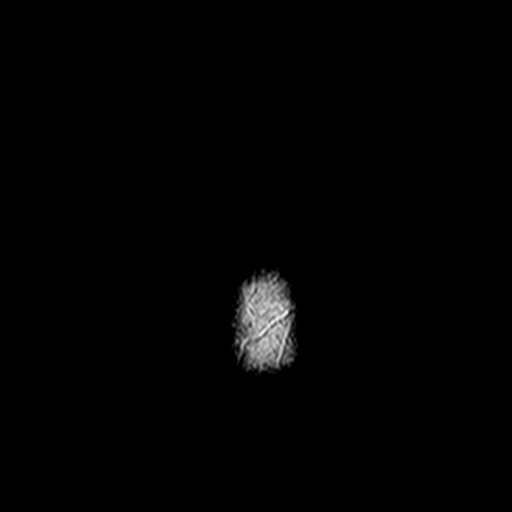

[Series 10: swi_images · axial · 4.0mm · 0.90mm/px · z∈[-65,+90]mm · 2 of 40 slices shown]
[im 1/40]
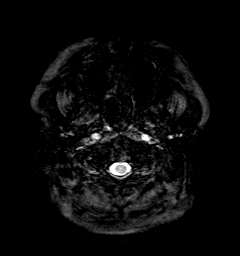
[im 40/40]
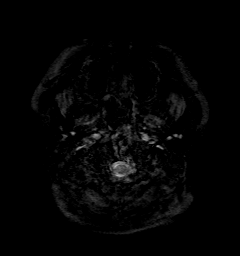

[Series 11: t1_mpr_tra · axial · 1.0mm · 0.75mm/px · z∈[-64,+95]mm · 8 of 160 slices shown]
[im 1/160]
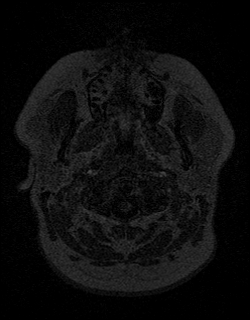
[im 18/160]
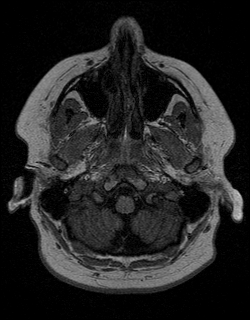
[im 54/160]
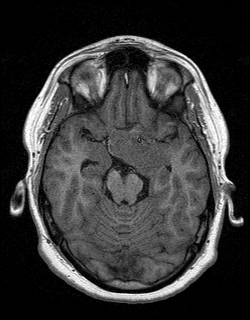
[im 71/160]
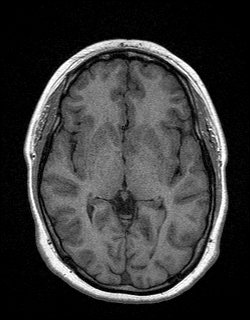
[im 89/160]
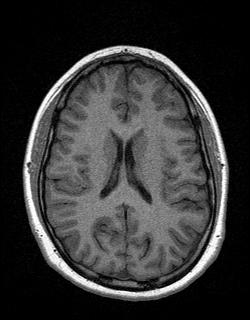
[im 107/160]
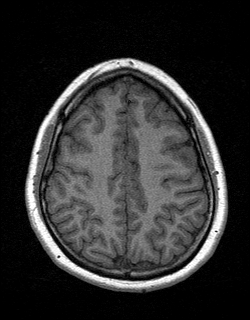
[im 142/160]
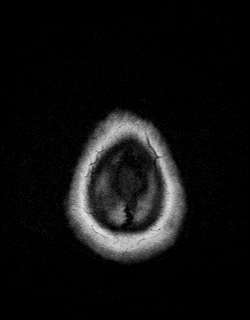
[im 160/160]
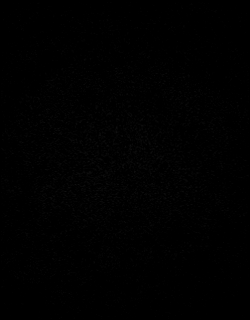

[Series 12: T2 · coronal · 5.0mm · 0.45mm/px · 2 of 28 slices shown (2 of 2)]
[im 1/28]
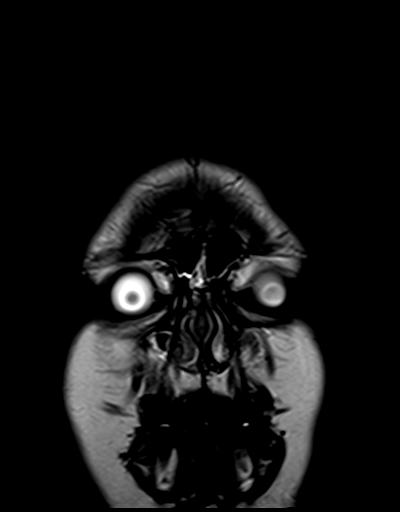
[im 28/28]
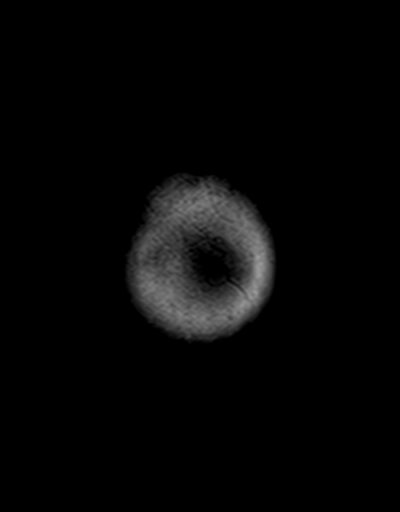

[Series 13: t1_mpr_tra post · axial · 1.0mm · 0.75mm/px · z∈[-64,+6]mm · 4 of 160 slices shown]
[im 1/160]
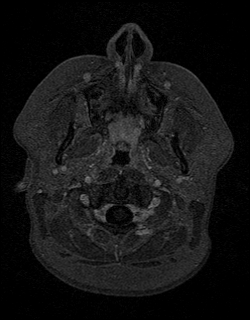
[im 18/160]
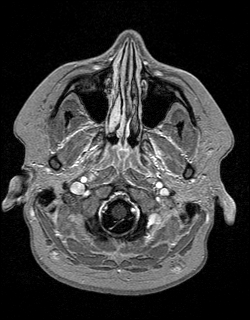
[im 54/160]
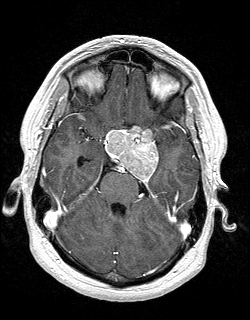
[im 71/160]
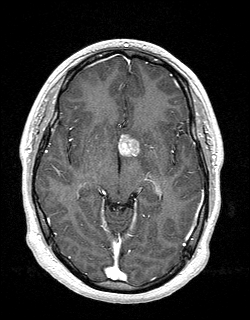

[Series 15: T1 post-contrast · sagittal · 5.0mm · 0.46mm/px · 1 of 22 slices shown (1 of 2)]
[im 1/22]
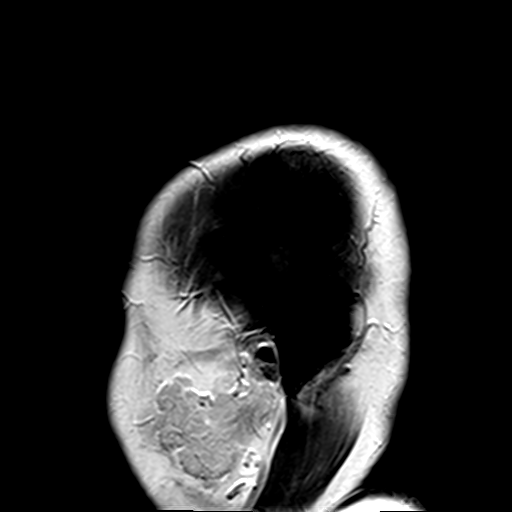

[Series 16: T1 post-contrast · coronal · 3.0mm · 0.33mm/px · 1 of 19 slices shown (2 of 2)]
[im 1/19]
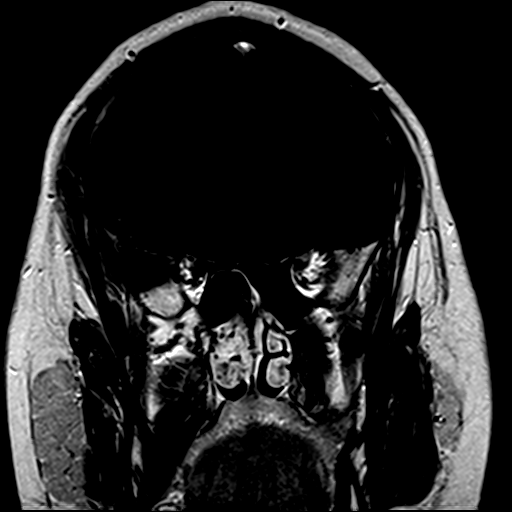

[38 of 48 positions shown; findings below may reference images not displayed]

FINDINGS: Brain: No normal pituitary parenchyma is identified. Instead there
is a large, lobulated and infiltrative skull base mass with
heterogeneous enhancement occupying the sella turcica, continuing
into the suprasellar cistern with mass effect on the undersurface of
the brain (series 15, image 12), completely infiltrating the left
cavernous sinus (series 16, image 11), extending into the posterior
sphenoid sinuses especially the left (series 13, image 32),
extending into the left orbital apex (series 13, image 44), and also
tracking posteriorly toward the left cerebellar tentorium (series
13, image 54). The mass completely engulfs the left ICA siphon which
appears to remain patent (series 15, image 13), and also abuts most
of the right ICA siphon. The mass the mass has a combination of
smooth and highly lobulated margins, and has smoothly remodeled the
left clivus as seen on series 2, image 12. There is partial
effacement of the left prepontine cistern (series 7, image 7). The
left Meckel's cave is partially infiltrated and/or compressed
(series 16, image 8). The mass also involves the left foramen
rotundum. All told, the lesion encompasses 50 by 44 by 49 mm (AP by
transverse by CC). The lesion has isointense diffusion. SWI is
limited for mineralization of the mass due to proximity to the skull
base.

Severe mass effect on the optic chiasm and proximal left optic
nerve, which cannot be delineated separate from the mass.

There is trace rightward midline shift but despite the mass effect
and multi spatial involvement no cerebral edema is identified. No
other abnormal intracranial enhancement is identified. No
superimposed restricted diffusion to suggest acute infarction. No
ventriculomegaly, extra-axial fluid collection or acute intracranial
hemorrhage. Cervicomedullary junction within normal limits. No
encephalomalacia or chronic cerebral blood products identified.

Vascular: Major intracranial vascular flow voids are preserved,
despite the large central skull base mass described above. There is
mild mass effect on the left ICA siphon and left ICA terminus. Aside
from the cavernous sinuses described above, the major dural venous
sinuses are enhancing and appear to be patent.

Skull and upper cervical spine: Background bone marrow signal is
within normal limits. There appears to be congenital incomplete
segmentation of the C2-C3 vertebrae, normal variant.

Sinuses/Orbits: Severe mass effect on the optic chiasm and left
optic nerve to the orbital apex. The right orbital apex is
relatively spared. Other intraorbital soft tissues appears symmetric
and normal.

There is mild maxillary and ethmoid sinus mucosal thickening. As
described above there is some tumor in the posterior sphenoid
sinuses, especially the left.

Other: Mastoid air cells remain clear. Visible internal auditory
structures appear normal. Negative visible scalp and face soft
tissues.
IMPRESSION: 1. Large, lobulated and smoothly infiltrating extra-axial mass of
the central skull base measures roughly 5 cm diameter, is eccentric
to the left, and could be a large Pituitary Macroadenoma or a skull
base Meningioma.
Severe involvement of the sella turcica, left cavernous sinus, left
ICA siphon, suprasellar cistern, and optic chiasm.
Early involvement of the left orbital apex and prepontine cistern,
as well as some tumor extension suspected into the posterior
sphenoid sinuses, more so the left.
Despite the mass effect, there is no cerebral edema.
2. Recommend Neurosurgery consultation.
3. Possible congenital incomplete segmentation of the C2-C3
vertebrae.

ADDENDUM:
Study discussed by telephone with Dr. POZISA on [DATE]
at [DT] hours.

*** End of Addendum ***
FINDINGS: Brain: No normal pituitary parenchyma is identified. Instead there
is a large, lobulated and infiltrative skull base mass with
heterogeneous enhancement occupying the sella turcica, continuing
into the suprasellar cistern with mass effect on the undersurface of
the brain (series 15, image 12), completely infiltrating the left
cavernous sinus (series 16, image 11), extending into the posterior
sphenoid sinuses especially the left (series 13, image 32),
extending into the left orbital apex (series 13, image 44), and also
tracking posteriorly toward the left cerebellar tentorium (series
13, image 54). The mass completely engulfs the left ICA siphon which
appears to remain patent (series 15, image 13), and also abuts most
of the right ICA siphon. The mass the mass has a combination of
smooth and highly lobulated margins, and has smoothly remodeled the
left clivus as seen on series 2, image 12. There is partial
effacement of the left prepontine cistern (series 7, image 7). The
left Meckel's cave is partially infiltrated and/or compressed
(series 16, image 8). The mass also involves the left foramen
rotundum. All told, the lesion encompasses 50 by 44 by 49 mm (AP by
transverse by CC). The lesion has isointense diffusion. SWI is
limited for mineralization of the mass due to proximity to the skull
base.

Severe mass effect on the optic chiasm and proximal left optic
nerve, which cannot be delineated separate from the mass.

There is trace rightward midline shift but despite the mass effect
and multi spatial involvement no cerebral edema is identified. No
other abnormal intracranial enhancement is identified. No
superimposed restricted diffusion to suggest acute infarction. No
ventriculomegaly, extra-axial fluid collection or acute intracranial
hemorrhage. Cervicomedullary junction within normal limits. No
encephalomalacia or chronic cerebral blood products identified.

Vascular: Major intracranial vascular flow voids are preserved,
despite the large central skull base mass described above. There is
mild mass effect on the left ICA siphon and left ICA terminus. Aside
from the cavernous sinuses described above, the major dural venous
sinuses are enhancing and appear to be patent.

Skull and upper cervical spine: Background bone marrow signal is
within normal limits. There appears to be congenital incomplete
segmentation of the C2-C3 vertebrae, normal variant.

Sinuses/Orbits: Severe mass effect on the optic chiasm and left
optic nerve to the orbital apex. The right orbital apex is
relatively spared. Other intraorbital soft tissues appears symmetric
and normal.

There is mild maxillary and ethmoid sinus mucosal thickening. As
described above there is some tumor in the posterior sphenoid
sinuses, especially the left.

Other: Mastoid air cells remain clear. Visible internal auditory
structures appear normal. Negative visible scalp and face soft
tissues.
IMPRESSION: 1. Large, lobulated and smoothly infiltrating extra-axial mass of
the central skull base measures roughly 5 cm diameter, is eccentric
to the left, and could be a large Pituitary Macroadenoma or a skull
base Meningioma.
Severe involvement of the sella turcica, left cavernous sinus, left
ICA siphon, suprasellar cistern, and optic chiasm.
Early involvement of the left orbital apex and prepontine cistern,
as well as some tumor extension suspected into the posterior
sphenoid sinuses, more so the left.
Despite the mass effect, there is no cerebral edema.
2. Recommend Neurosurgery consultation.
3. Possible congenital incomplete segmentation of the C2-C3
vertebrae.

## 2021-04-06 MED ORDER — GADOBENATE DIMEGLUMINE 529 MG/ML IV SOLN
20.0000 mL | Freq: Once | INTRAVENOUS | Status: AC | PRN
  Administered 2021-04-06: 20 mL via INTRAVENOUS

## 2021-04-10 ENCOUNTER — Other Ambulatory Visit: Payer: Self-pay | Admitting: Ophthalmology

## 2021-04-10 DIAGNOSIS — H469 Unspecified optic neuritis: Secondary | ICD-10-CM

## 2021-04-10 DIAGNOSIS — H5340 Unspecified visual field defects: Secondary | ICD-10-CM

## 2021-04-12 DIAGNOSIS — D352 Benign neoplasm of pituitary gland: Secondary | ICD-10-CM | POA: Diagnosis not present

## 2021-05-07 ENCOUNTER — Other Ambulatory Visit: Payer: Self-pay

## 2021-05-07 ENCOUNTER — Ambulatory Visit (INDEPENDENT_AMBULATORY_CARE_PROVIDER_SITE_OTHER): Payer: BC Managed Care – PPO | Admitting: Internal Medicine

## 2021-05-07 ENCOUNTER — Encounter: Payer: Self-pay | Admitting: Internal Medicine

## 2021-05-07 VITALS — BP 124/80 | HR 71 | Ht 71.0 in | Wt 258.4 lb

## 2021-05-07 DIAGNOSIS — D352 Benign neoplasm of pituitary gland: Secondary | ICD-10-CM

## 2021-05-07 DIAGNOSIS — Q5320 Undescended testicle, unspecified, bilateral: Secondary | ICD-10-CM | POA: Diagnosis not present

## 2021-05-07 LAB — COMPREHENSIVE METABOLIC PANEL
ALT: 35 U/L (ref 0–53)
AST: 34 U/L (ref 0–37)
Albumin: 4.6 g/dL (ref 3.5–5.2)
Alkaline Phosphatase: 39 U/L (ref 39–117)
BUN: 14 mg/dL (ref 6–23)
CO2: 28 mEq/L (ref 19–32)
Calcium: 9.9 mg/dL (ref 8.4–10.5)
Chloride: 106 mEq/L (ref 96–112)
Creatinine, Ser: 0.83 mg/dL (ref 0.40–1.50)
GFR: 124.77 mL/min (ref >60.00)
Glucose, Bld: 94 mg/dL (ref 70–99)
Potassium: 4 mEq/L (ref 3.5–5.1)
Sodium: 143 mEq/L (ref 135–145)
Total Bilirubin: 1.1 mg/dL (ref 0.2–1.2)
Total Protein: 7.7 g/dL (ref 6.0–8.3)

## 2021-05-07 NOTE — Progress Notes (Signed)
Pituitary    Name: Travis Griffin  MRN/ DOB: [id number], July 24, 2001    Age/ Sex: 20 y.o., male    PCP: Patient, No Pcp Per (Inactive)   Reason for Endocrinology Evaluation: Pituitary Macroadenoma      Date of Initial Endocrinology Evaluation: 05/08/2021     HPI: Mr. Travis Griffin is a 20 y.o. male with a past medical history of Pituitary Adenoma . The patient presented for initial endocrinology clinic visit on 05/08/2021 for consultative assistance with his Pituitary Macroadenoma/Prolactinoma .    He has been referred here by Dr. Jearl Klinefelter for further evaluation of prolactinoma. He as been  evaluated for pituitary macroadenoma at 5 cm in max diameter,  infiltrating the left cavernous sinus , extending into the posterior sphenoid sinuses especially the left  extending into the left orbital apex , and also tracking posteriorly toward the left cerebellar tentorium. The mass completely engulfs the left ICA siphon which appears to remain patent , and also abuts most of the right ICA siphon.   Pt has been noted with suprasellar mass during ophthalmological examination for visual field defect  He has noted visual changes  described as blurry vision for ~6 yrs Has noted galactorrhea around 6 yrs ago as well, mainly from the right  nipple   Denies headaches until started on Cabergoline which was started 04/2021    Has noted axillar and genital hair growth at age 89 . No facial hair , does not shave   Denies nausea or  diarrhea   Height has been stable for years    No FH of endocrine disease or diabetes  Older brother with low sperm count , had to take testosterone and able to father biological children    Has not been sexually active  He has erections , rare spontaneous erections  Decreased Libido     HISTORY:  Past Medical History: No past medical history on file. Past Surgical History:  Past Surgical History:  Procedure Laterality Date   left testicular  surgery Left      Social History:  reports that he has never smoked. He has never used smokeless tobacco. He reports that he does not drink alcohol and does not use drugs. Family History: family history is not on file.   HOME MEDICATIONS: Allergies as of 05/07/2021       Reactions   Amoxicillin    Milk-related Compounds Diarrhea   Stomach cramps with milk and ice-cream   Penicillins Hives   Has patient had a PCN reaction causing immediate rash, facial/tongue/throat swelling, SOB or lightheadedness with hypotension: Unknown Has patient had a PCN reaction causing severe rash involving mucus membranes or skin necrosis: Unknown Has patient had a PCN reaction that required hospitalization: Unknown Has patient had a PCN reaction occurring within the last 10 years: Unknown If all of the above answers are "NO", then may proceed with Cephalosporin use.        Medication List        Accurate as of May 07, 2021 11:59 PM. If you have any questions, ask your nurse or doctor.          STOP taking these medications    promethazine-dextromethorphan 6.25-15 MG/5ML syrup Commonly known as: PROMETHAZINE-DM Stopped by: Dorita Sciara, MD       TAKE these medications    cabergoline 0.5 MG tablet Commonly known as: DOSTINEX Take 0.25 mg by mouth 2 (two) times a week.   fluticasone 50 MCG/ACT nasal spray Commonly  known as: FLONASE Place 2 sprays into both nostrils daily.   ibuprofen 400 MG tablet Commonly known as: ADVIL Take 1 tablet (400 mg total) by mouth every 6 (six) hours as needed.          REVIEW OF SYSTEMS: A comprehensive ROS was conducted with the patient and is negative except as per HPI     OBJECTIVE:  VS: BP 124/80 (BP Location: Left Arm, Patient Position: Sitting, Cuff Size: Large)   Pulse 71   Ht '5\' 11"'$  (1.803 m)   Wt 258 lb 6.4 oz (117.2 kg)   SpO2 98%   BMI 36.04 kg/m    Wt Readings from Last 3 Encounters:  05/07/21 258 lb 6.4 oz (117.2 kg)  05/10/18 240 lb  (108.9 kg) (99 %, Z= 2.27)*  04/24/18 250 lb (113.4 kg) (>99 %, Z= 2.43)*   * Growth percentiles are based on CDC (Boys, 2-20 Years) data.     EXAM: General: Pt appears eunuchoid   Neck: General: Supple without adenopathy. Thyroid: Thyroid size normal.  No goiter or nodules appreciated.   Chest : Right breast gynecomastia at 10 O'clock   Lungs: Clear with good BS bilat with no rales, rhonchi, or wheezes  Heart: Auscultation: RRR.  Abdomen: Normoactive bowel sounds, soft, nontender, without masses or organomegaly palpable  Genital : Small penile size for age, undescended testicles B/L   Extremities:  BL LE: No pretibial edema normal ROM and strength.  Skin: Hair: Fine thin hair at the genital area, pt lacks androgen dependent hair growth ( no facial hair, abdomen, chest or back ) Skin Inspection: No rashes, no acne   Neuro: Cranial nerves: II - XII grossly intact  Motor: Normal strength throughout DTRs: 2+ and symmetric in UE without delay in relaxation phase  Mental Status: Judgment, insight: Intact Orientation: Oriented to time, place, and person Mood and affect: No depression, anxiety, or agitation     DATA REVIEWED: Results for AERION, VOLKER (MRN [id number]) as of 05/08/2021 07:00  Ref. Range 05/07/2021 11:05  Sodium Latest Ref Range: 135 - 145 mEq/L 143  Potassium Latest Ref Range: 3.5 - 5.1 mEq/L 4.0  Chloride Latest Ref Range: 96 - 112 mEq/L 106  CO2 Latest Ref Range: 19 - 32 mEq/L 28  Glucose Latest Ref Range: 70 - 99 mg/dL 94  BUN Latest Ref Range: 6 - 23 mg/dL 14  Creatinine Latest Ref Range: 0.40 - 1.50 mg/dL 0.83  Calcium Latest Ref Range: 8.4 - 10.5 mg/dL 9.9  Alkaline Phosphatase Latest Ref Range: 39 - 117 U/L 39  Albumin Latest Ref Range: 3.5 - 5.2 g/dL 4.6  AST Latest Ref Range: 0 - 37 U/L 34  ALT Latest Ref Range: 0 - 53 U/L 35  Total Protein Latest Ref Range: 6.0 - 8.3 g/dL 7.7  Total Bilirubin Latest Ref Range: 0.2 - 1.2 mg/dL 1.1  GFR Latest Ref  Range: >60.00 mL/min 124.77  Prolactin Latest Ref Range: 2.0 - 18.0 ng/mL 29.3 (H)    04/12/2021 @ 1045 AM FSH 4.3 LH 1.9 TSH 1.66 ACTH 38.3 Cortisol 14.4 Prolactin 513 ng/ML - Confirmed with Dilution  IGF-1  99 ng/mL ( 109-353)  MRI Brain 04/06/2021  Brain: No normal pituitary parenchyma is identified. Instead there is a large, lobulated and infiltrative skull base mass with heterogeneous enhancement occupying the sella turcica, continuing into the suprasellar cistern with mass effect on the undersurface of the brain (series 15, image 12), completely infiltrating the left cavernous sinus (series 16,  image 11), extending into the posterior sphenoid sinuses especially the left (series 13, image 32), extending into the left orbital apex (series 13, image 44), and also tracking posteriorly toward the left cerebellar tentorium (series 13, image 54). The mass completely engulfs the left ICA siphon which appears to remain patent (series 15, image 13), and also abuts most of the right ICA siphon. The mass the mass has a combination of smooth and highly lobulated margins, and has smoothly remodeled the left clivus as seen on series 2, image 12. There is partial effacement of the left prepontine cistern (series 7, image 7). The left Meckel's cave is partially infiltrated and/or compressed (series 16, image 8). The mass also involves the left foramen rotundum. All told, the lesion encompasses 50 by 44 by 49 mm (AP by transverse by CC). The lesion has isointense diffusion. SWI is limited for mineralization of the mass due to proximity to the skull base.   Severe mass effect on the optic chiasm and proximal left optic nerve, which cannot be delineated separate from the mass.   There is trace rightward midline shift but despite the mass effect and multi spatial involvement no cerebral edema is identified. No other abnormal intracranial enhancement is identified. No superimposed restricted  diffusion to suggest acute infarction. No ventriculomegaly, extra-axial fluid collection or acute intracranial hemorrhage. Cervicomedullary junction within normal limits. No encephalomalacia or chronic cerebral blood products identified.   Vascular: Major intracranial vascular flow voids are preserved, despite the large central skull base mass described above. There is mild mass effect on the left ICA siphon and left ICA terminus. Aside from the cavernous sinuses described above, the major dural venous sinuses are enhancing and appear to be patent.   Skull and upper cervical spine: Background bone marrow signal is within normal limits. 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   Sinuses/Orbits: Severe mass effect on the optic chiasm and left optic nerve to the orbital apex. The right orbital apex is relatively spared. Other intraorbital soft tissues appears symmetric and normal.   There is mild maxillary and ethmoid sinus mucosal thickening. As described above there is some tumor in the posterior sphenoid sinuses, especially the left.   Other: Mastoid air cells remain clear. Visible internal auditory structures appear normal. Negative visible scalp and face soft tissues.   IMPRESSION: 1. Large, lobulated and smoothly infiltrating extra-axial mass of the central skull base measures roughly 5 cm diameter, is eccentric to the left, and could be a large Pituitary Macroadenoma or a skull base Meningioma. Severe involvement of the sella turcica, left cavernous sinus, left ICA siphon, suprasellar cistern, and optic chiasm. Early involvement of the left orbital apex and prepontine cistern, as well as some tumor extension suspected into the posterior sphenoid sinuses, more so the left. Despite the mass effect, there is no cerebral edema. 2. Recommend Neurosurgery consultation. 3. Possible congenital incomplete segmentation of the  C2-C3 vertebrae.   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 Zada Finders, ( Neurosurgery ) in June,  will continue with medical management at this time and perform short term MRI in the next few months  - He is also under the care of ophthalmology  - Will proceed with full pituitary panel   2. Macroprolactinoma   - With a baseline prolactin level of > 500 at  Neurosurgery  office  (confirmed with dilution) . On his initial visit to our clinic he was already on Cabergoline for ~ 10 days and repeat prolactin was down to 29   - NO changes   Medications : Continue Cabergoline 0.5 mg, Half a tablet TWICE weekly    3.Prepubertal Androgen Deficiency :  - This is secondary to Prolactinoma, as it appears this started around the age of 8 . Pt with eunuchoidism with poor muscle mass, lack of androgen dependant hair growth   - Pt will return for fasting 8 AM labs  - I expect his testosterone to increase as his prolactin trends down, will consider testosterone replacement therapy when needed    4. Undescended testes :  - Unable to palpate on today's exam, will proceed with scrotal ultrasound  - He has hx of left testicular sx for relocation     F/U in 3 months   Signed electronically by: Mack Guise, MD  [facility]  [address] [place], [place] [address] Phone: [phone] FAX: [phone]   CC: Patient, No Pcp Per (Inactive) No address on file Phone: None Fax: None   Return to Endocrinology clinic as below: Future Appointments  Date Time Provider [place]  05/14/2021  8:15 AM [facility] None  08/10/2021  9:50 AM Ronte Parker, Melanie Crazier, MD [facility] None

## 2021-05-08 DIAGNOSIS — Q5320 Undescended testicle, unspecified, bilateral: Secondary | ICD-10-CM | POA: Insufficient documentation

## 2021-05-08 DIAGNOSIS — D352 Benign neoplasm of pituitary gland: Secondary | ICD-10-CM | POA: Insufficient documentation

## 2021-05-08 LAB — PROLACTIN: Prolactin: 29.3 ng/mL — ABNORMAL HIGH (ref 2.0–18.0)

## 2021-05-08 MED ORDER — CABERGOLINE 0.5 MG PO TABS: 0.2500 mg | ORAL_TABLET | ORAL | 2 refills | Status: DC

## 2021-05-14 ENCOUNTER — Other Ambulatory Visit (INDEPENDENT_AMBULATORY_CARE_PROVIDER_SITE_OTHER): Payer: BC Managed Care – PPO

## 2021-05-14 ENCOUNTER — Other Ambulatory Visit: Payer: Self-pay

## 2021-05-14 DIAGNOSIS — D352 Benign neoplasm of pituitary gland: Secondary | ICD-10-CM

## 2021-05-14 LAB — T4, FREE: Free T4: 0.65 ng/dL (ref 0.60–1.60)

## 2021-05-14 LAB — LUTEINIZING HORMONE: LH: 2.3 m[IU]/mL (ref 1.50–9.30)

## 2021-05-14 LAB — FOLLICLE STIMULATING HORMONE: FSH: 2.9 m[IU]/mL (ref 1.4–18.1)

## 2021-05-14 LAB — CORTISOL: Cortisol, Plasma: 15.5 ug/dL

## 2021-05-14 LAB — TSH: TSH: 3.48 u[IU]/mL (ref 0.35–5.50)

## 2021-05-18 ENCOUNTER — Ambulatory Visit
Admission: RE | Admit: 2021-05-18 | Discharge: 2021-05-18 | Disposition: A | Discharge status: Home / Self Care | Payer: BC Managed Care – PPO | Source: Ambulatory Visit | Attending: Internal Medicine | Admitting: Internal Medicine

## 2021-05-18 ENCOUNTER — Telehealth: Payer: Self-pay | Admitting: Internal Medicine

## 2021-05-18 DIAGNOSIS — Q539 Undescended testicle, unspecified: Secondary | ICD-10-CM | POA: Diagnosis not present

## 2021-05-18 DIAGNOSIS — Z9889 Other specified postprocedural states: Secondary | ICD-10-CM | POA: Diagnosis not present

## 2021-05-18 DIAGNOSIS — Q5320 Undescended testicle, unspecified, bilateral: Secondary | ICD-10-CM

## 2021-05-18 LAB — INSULIN-LIKE GROWTH FACTOR
IGF-I, LC/MS: 100 ng/mL (ref 83–456)
Z-Score (Male): -1.8 SD (ref ?–2.0)

## 2021-05-18 LAB — ACTH: C206 ACTH: 43 pg/mL (ref 6–50)

## 2021-05-18 LAB — TESTOSTERONE, TOTAL, LC/MS/MS: Testosterone, Total, LC-MS-MS: 58 ng/dL — ABNORMAL LOW (ref 250–1100)

## 2021-05-18 IMAGING — US US SCROTUM
1 series · 14 of 25 positions shown · non-contrast
Comparison: None.

CLINICAL DATA: Undescended testicles on examinations, prolactinoma,
chart indicating history of left testicular surgery

EXAM:
ULTRASOUND OF SCROTUM
TECHNIQUE: Complete ultrasound examination of the testicles, epididymis, and
other scrotal structures was performed.

[Series 1: us scrotum · 0.05mm/px · 14 of 31 slices shown]
[im 1/31]
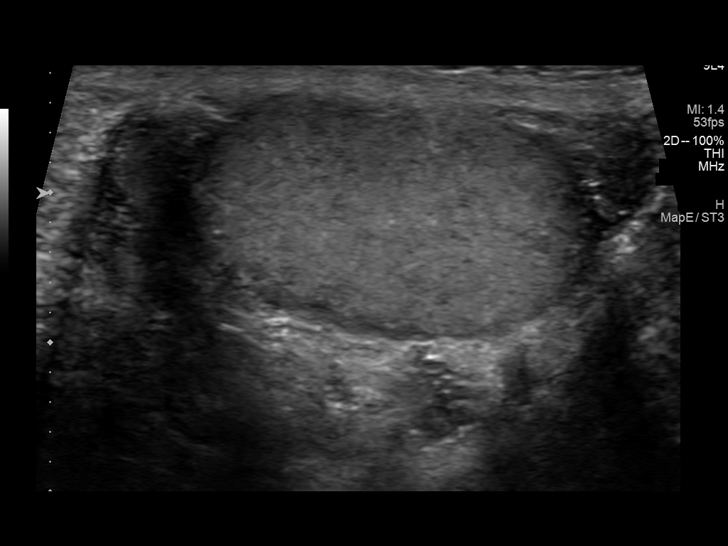
[im 3/31]
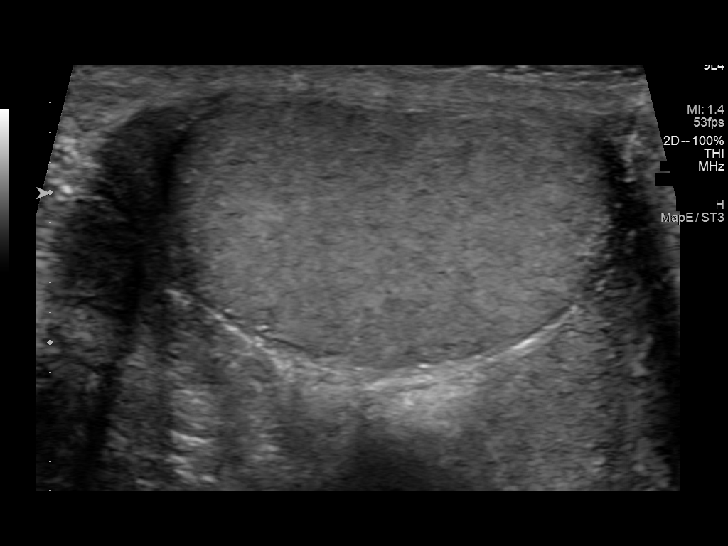
[im 6/31]
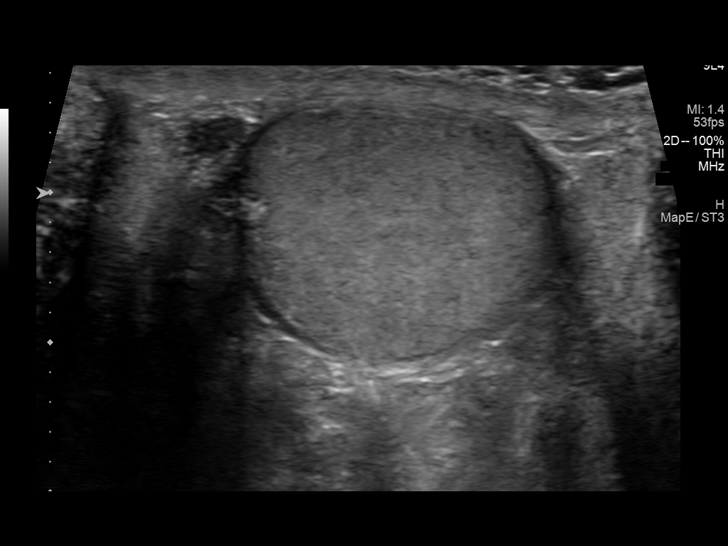
[im 8/31]
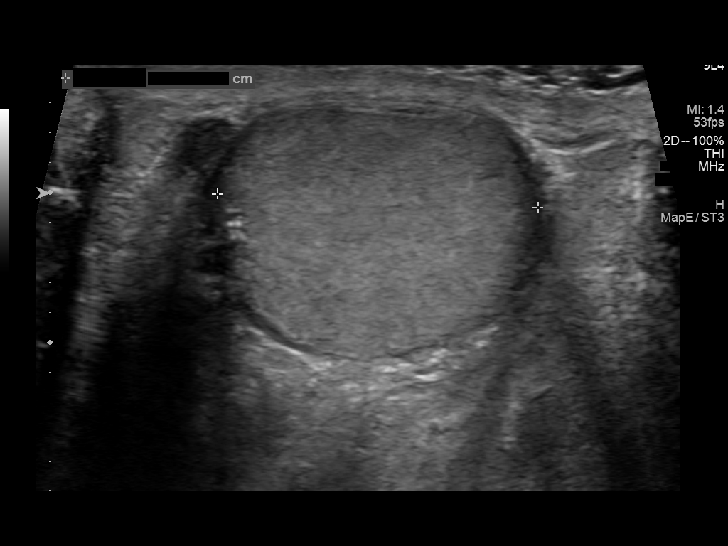
[im 11/31]
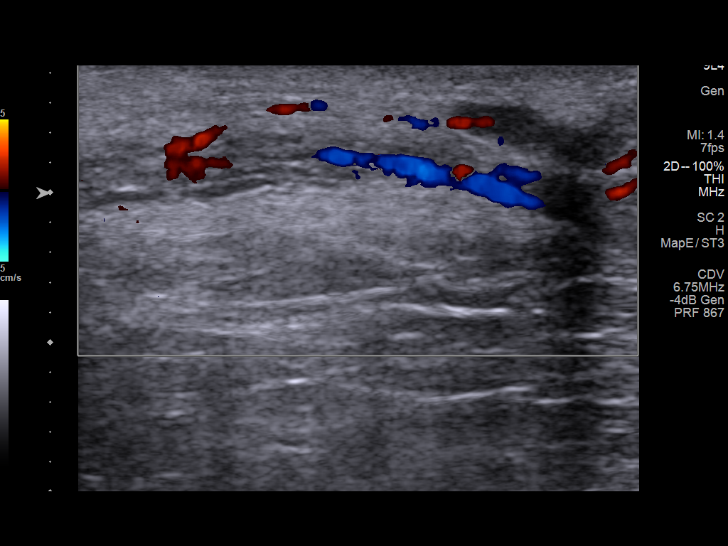
[im 12/31]
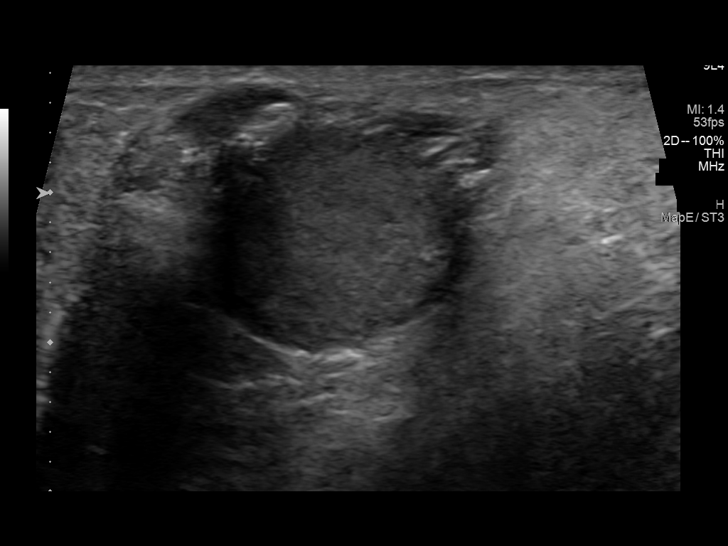
[im 14/31]
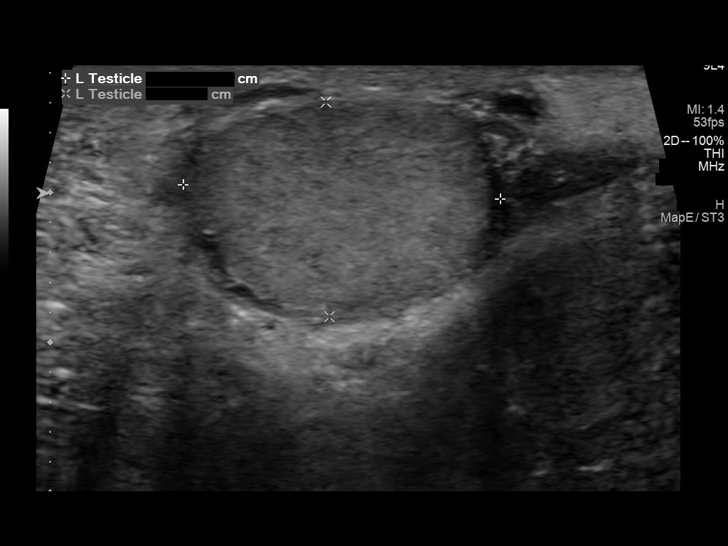
[im 17/31]
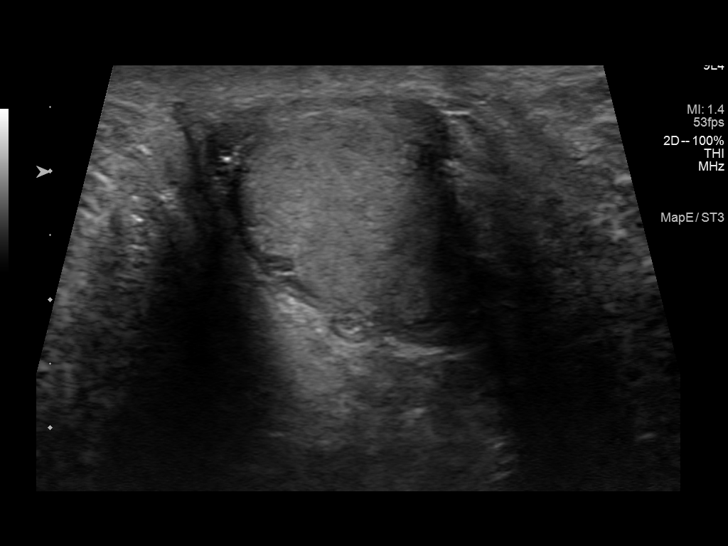
[im 19/31]
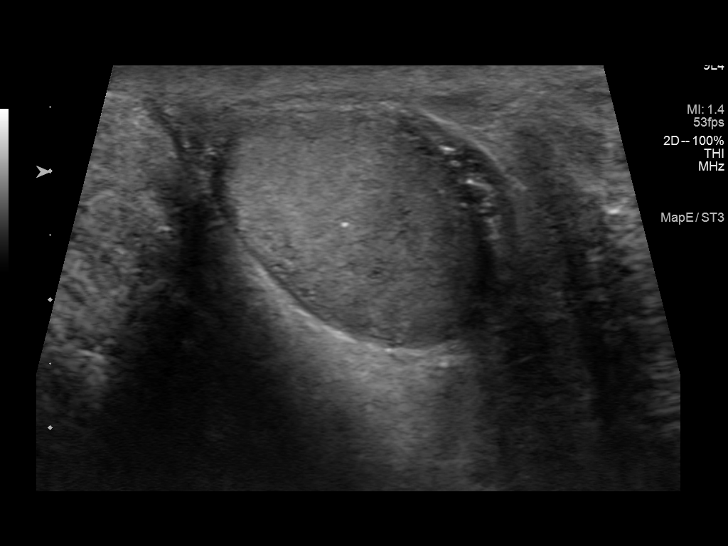
[im 21/31]
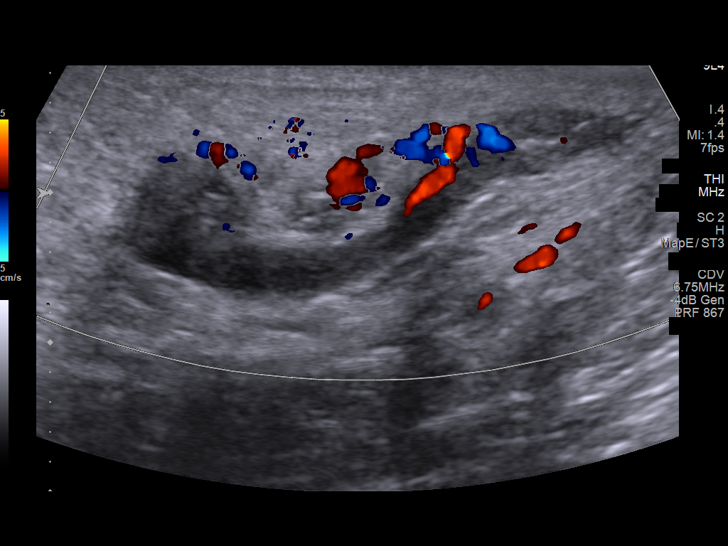
[im 23/31]
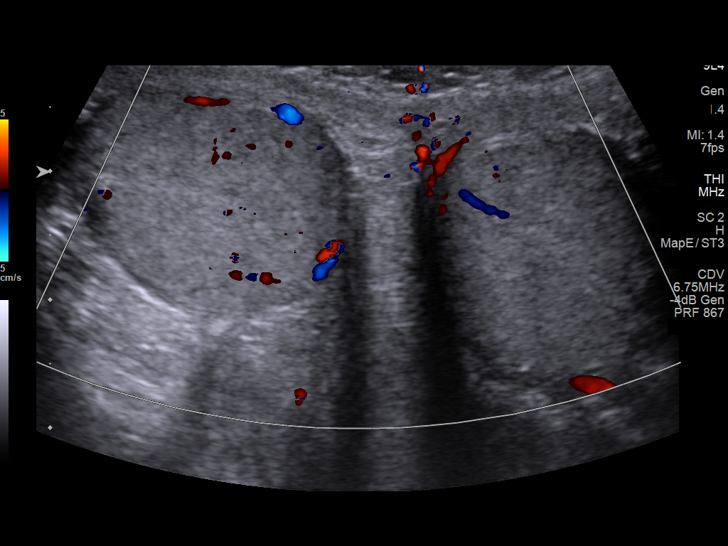
[im 26/31]
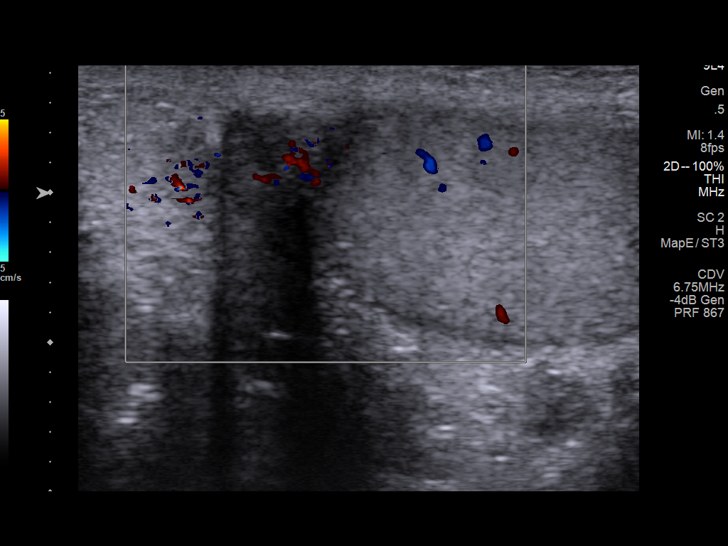
[im 28/31]
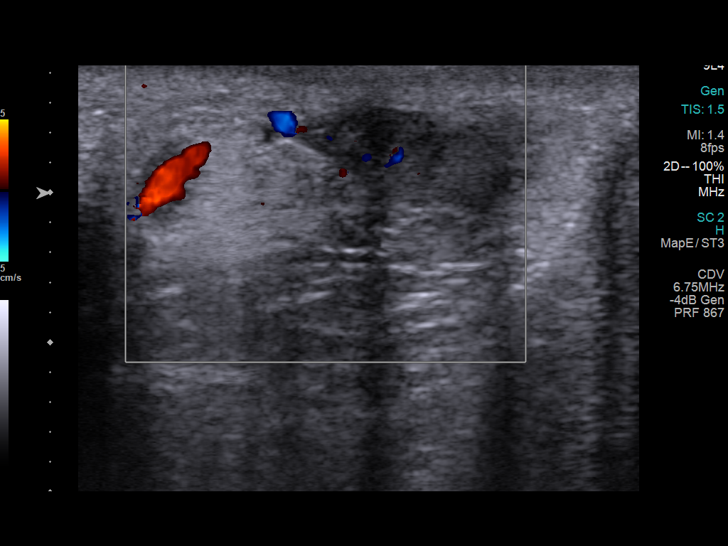
[im 31/31]
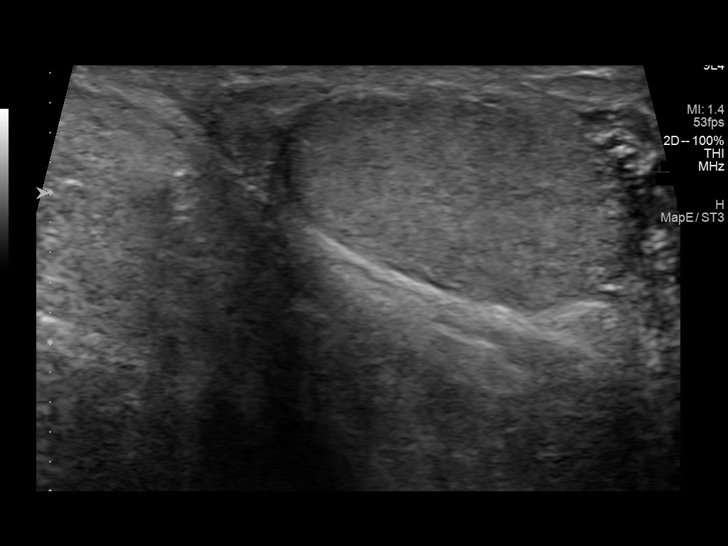

[14 of 25 positions shown; findings below may reference images not displayed]

FINDINGS: Right testicle

Measurements: 3 x 1.7 x 2.1 cm. Volume = 5.6 mL. No mass or
microlithiasis visualized. Normal color flow.

Left testicle

Measurements: 2.1 x 1.4 x 2.4 cm. Volume = 3.7 mL. No mass or
microlithiasis visualized. Normal color flow.

Right epididymis:  Normal in size and appearance.

Left epididymis:  Normal in size and appearance.

Hydrocele:  None visualized.

Varicocele:  None visualized.
IMPRESSION: Slightly high-riding appearance of the testicles.

Per chart there is a history of left testicular surgery, correlate
for prior orchiopexy.

Grossly diminutive size of the testes for patient age. (Normal
volume 12.5-19 mL. Reference: CLEBER HIDEKI. US
MR imaging correlation in pathologic conditions of the scrotum.
Radiographics. 27 (5): [FH]-53. doi:10.1148/rg.[PHONE_NUMBER]).

## 2021-05-18 MED ORDER — TESTOSTERONE 20.25 MG/ACT (1.62%) TD GEL: 40.5000 mg | Freq: Every day | TRANSDERMAL | 1 refills | Status: DC

## 2021-05-18 NOTE — Telephone Encounter (Signed)
Discussed low testosterone with the patient on 05/18/2021   Results for Travis Griffin, Travis Griffin (MRN [id number]) as of 05/18/2021 18:16  Ref. Range 05/14/2021 08:15  Testosterone, Total, LC-MS-MS Latest Ref Range: 250 - 1,100 ng/dL 58 (L)    I have recommended proceeding with testosterone replacement therapy  We will start with topical therapy at this time  1 pump to each shoulder daily   I have advised the patient to avoid skin to skin contact due to transmission of the medicine   Will recheck on the next visit  Abby Nena Jordan, MD  [facility]  [facility] [address] [place], [place] [address] Phone: [phone] FAX: [phone]

## 2021-05-21 ENCOUNTER — Telehealth: Payer: Self-pay | Admitting: Pharmacy Technician

## 2021-05-21 ENCOUNTER — Other Ambulatory Visit (HOSPITAL_COMMUNITY): Payer: Self-pay

## 2021-05-21 NOTE — Telephone Encounter (Addendum)
Patient Advocate Encounter   Received notification from CoverMyMeds that prior authorization for Testosterone is required.   PA submitted on 05/21/2021 Key [id number]  Sent message to MD to update dx for low testosterone. 06/04/21 dx has been updated.  Faxed additional notes and labs to ins for further consideration. 06/04/21  Status is approved 06/06/2021-06/06/2022    However, looks like his copay is over $80. May be cheaper for him to get it without ins. If not at his regular pharmacy, [facility], based on test claims.   Armanda Magic, CPhT Patient Advocate [facility] Phone: [phone] Fax:  [phone] 2

## 2021-05-31 ENCOUNTER — Encounter: Payer: Self-pay | Admitting: Internal Medicine

## 2021-06-01 ENCOUNTER — Other Ambulatory Visit: Payer: Self-pay | Admitting: Internal Medicine

## 2021-06-01 DIAGNOSIS — E291 Testicular hypofunction: Secondary | ICD-10-CM | POA: Insufficient documentation

## 2021-06-04 ENCOUNTER — Telehealth: Payer: Self-pay | Admitting: Internal Medicine

## 2021-06-04 ENCOUNTER — Other Ambulatory Visit: Payer: Self-pay

## 2021-06-04 ENCOUNTER — Encounter: Payer: Self-pay | Admitting: *Deleted

## 2021-06-04 ENCOUNTER — Ambulatory Visit
Admission: EM | Admit: 2021-06-04 | Discharge: 2021-06-04 | Disposition: A | Discharge status: Home / Self Care | Payer: BC Managed Care – PPO | Attending: Family Medicine | Admitting: Family Medicine

## 2021-06-04 ENCOUNTER — Other Ambulatory Visit (HOSPITAL_COMMUNITY): Payer: Self-pay

## 2021-06-04 DIAGNOSIS — H00014 Hordeolum externum left upper eyelid: Secondary | ICD-10-CM | POA: Diagnosis not present

## 2021-06-04 DIAGNOSIS — D352 Benign neoplasm of pituitary gland: Secondary | ICD-10-CM

## 2021-06-04 MED ORDER — SULFAMETHOXAZOLE-TRIMETHOPRIM 800-160 MG PO TABS: 1.0000 | ORAL_TABLET | Freq: Two times a day (BID) | ORAL | 0 refills | Status: AC

## 2021-06-04 MED ORDER — PREDNISONE 20 MG PO TABS: 20.0000 mg | ORAL_TABLET | Freq: Every day | ORAL | 0 refills | Status: AC

## 2021-06-04 NOTE — ED Triage Notes (Signed)
Pt reports swelling to Lt upper eye lid since Sunday.

## 2021-06-04 NOTE — Telephone Encounter (Signed)
Please bring the pt in to see me today 06/04/2021 at 1:40 pm for severe headaches     Thanks

## 2021-06-04 NOTE — Telephone Encounter (Signed)
I have contacted the pt on 06/04/2021 over the phone at 1300 . Unfortunately, no one called him yet to schedule him to see me at 1340 .   Headaches are frontal and occipital at times, headaches at 9/10 at times. Radiate to the eyes  Denies vision changes,  nausea or vomiting    He takes Tylenol 1000 mg x1 . I have asked him to take Naproxen 200 mg if not better in 30 minutes    Will order an MRI  Will refer to neurology for headache management      Abby Nena Jordan, MD  [facility] Endocrinology  [facility] [address] [place], [place] [address] Phone: [phone] FAX: [phone]

## 2021-06-07 ENCOUNTER — Other Ambulatory Visit (HOSPITAL_COMMUNITY): Payer: Self-pay

## 2021-06-09 NOTE — ED Provider Notes (Signed)
[facility]    CSN: [id number] Arrival date & time: 06/04/21  1036      History   Chief Complaint Chief Complaint  Patient presents with   sweling to eye lid    HPI Travis Griffin is a 20 y.o. male.   HPI Patient presents today with localized swelling to the left upper eyelid.  Patient reports initially the area was slightly tender however has become more erythematous and edematous over the course of the last 24 hours.  Denies any drainage or crusting in the eye.  Denies any visual acuity changes.  Denies any knowledge of being bitten by an insect  History reviewed. No pertinent past medical history.  Patient Active Problem List   Diagnosis Date Noted   Secondary testicular hypogonadism 06/01/2021   Pituitary macroadenoma with extrasellar extension ([place]) 05/08/2021   Bilateral undescended testicles 05/08/2021   Pituitary macroadenoma ([place]) 05/08/2021   Prolactinoma ([place]) 05/08/2021    Past Surgical History:  Procedure Laterality Date   left testicular  surgery Left        Home Medications    Prior to Admission medications   Medication Sig Start Date End Date Taking? Authorizing Provider  sulfamethoxazole-trimethoprim (BACTRIM DS) 800-160 MG tablet Take 1 tablet by mouth 2 (two) times daily for 7 days. 06/04/21 06/11/21 Yes Scot Jun, FNP  cabergoline (DOSTINEX) 0.5 MG tablet Take 0.5 tablets (0.25 mg total) by mouth 2 (two) times a week. 05/10/21   Shamleffer, Melanie Crazier, MD  fluticasone (FLONASE) 50 MCG/ACT nasal spray Place 2 sprays into both nostrils daily. 11/17/20   [provider]  ibuprofen (ADVIL,MOTRIN) 400 MG tablet Take 1 tablet (400 mg total) by mouth every 6 (six) hours as needed. 05/10/18   Noemi Chapel, MD  Testosterone 20.25 MG/ACT (1.62%) GEL Place 40.5 mg onto the skin daily in the afternoon. 05/18/21   Shamleffer, Melanie Crazier, MD    Family History History reviewed. No pertinent family history.  Social  History Social History   Tobacco Use   Smoking status: Never   Smokeless tobacco: Never  Vaping Use   Vaping Use: Never used  Substance Use Topics   Alcohol use: Never   Drug use: Never     Allergies   Amoxicillin, Milk-related compounds, and Penicillins   Review of Systems Review of Systems Pertinent negatives listed in HPI  Physical Exam Triage Vital Signs ED Triage Vitals [06/04/21 1125]  Enc Vitals Group     BP 133/78     Pulse Rate (!) 52     Resp 16     Temp 98.3 F (36.8 C)     Temp src      SpO2 97 %     Weight      Height      Head Circumference      Peak Flow      Pain Score 4     Pain Loc      Pain Edu?      Excl. in [place]?    No data found.  Updated Vital Signs BP 133/78   Pulse (!) 52   Temp 98.3 F (36.8 C)   Resp 16   SpO2 97%   Visual Acuity Right Eye Distance:   Left Eye Distance:   Bilateral Distance:    Right Eye Near:   Left Eye Near:    Bilateral Near:     Physical Exam Constitutional:      Appearance: He is obese.  HENT:  Head: Normocephalic and atraumatic.     Nose: Nose normal.  Eyes:     General:        Left eye: Hordeolum present.    Extraocular Movements: Extraocular movements intact.     Pupils: Pupils are equal, round, and reactive to light.   Cardiovascular:     Rate and Rhythm: Normal rate and regular rhythm.  Pulmonary:     Effort: Pulmonary effort is normal.     Breath sounds: Normal breath sounds and air entry.  Neurological:     General: No focal deficit present.     Mental Status: He is alert and oriented to person, place, and time.     GCS: GCS eye subscore is 4. GCS verbal subscore is 5. GCS motor subscore is 6.     Cranial Nerves: Cranial nerves are intact.     Sensory: Sensation is intact.     Motor: Motor function is intact.     Coordination: Coordination is intact.  Psychiatric:        Attention and Perception: Attention normal.        Mood and Affect: Mood normal.        Speech: Speech  normal.     UC Treatments / Results  Labs (all labs ordered are listed, but only abnormal results are displayed) Labs Reviewed - No data to display  EKG   Radiology No results found.  Procedures Procedures (including critical care time)  Medications Ordered in UC Medications - No data to display  Initial Impression / Assessment and Plan / UC Course  I have reviewed the triage vital signs and the nursing notes.  Pertinent labs & imaging results that were available during my care of the patient were reviewed by me and considered in my medical decision making (see chart for details).    Left upper eyelid stye treatment today with prednisone to decrease eyelid swelling and Bactrim for treatment of secondary bacterial infection.  ER precautions given if any visual acuity changes or eyelid swelling worsens.  Return precautions given. Final Clinical Impressions(s) / UC Diagnoses   Final diagnoses:  Hordeolum externum of left upper eyelid   Discharge Instructions   None    ED Prescriptions     Medication Sig Dispense Auth. Provider   predniSONE (DELTASONE) 20 MG tablet Take 1 tablet (20 mg total) by mouth daily with breakfast for 3 days. 3 tablet Scot Jun, FNP   sulfamethoxazole-trimethoprim (BACTRIM DS) 800-160 MG tablet Take 1 tablet by mouth 2 (two) times daily for 7 days. 14 tablet Scot Jun, FNP      PDMP not reviewed this encounter.   Scot Jun, [place] 06/09/21 [phone]

## 2021-06-11 NOTE — Telephone Encounter (Signed)
Called informed that  the PA was approved for medication and advised pt of the copay of $80 pt , pt said that out of pocket would be $400. Pt said that he would contact the pharmacy.

## 2021-06-12 NOTE — Telephone Encounter (Signed)
Dr. Kelton Pillar has already spoke with patient regarding headaches

## 2021-08-10 ENCOUNTER — Encounter: Payer: Self-pay | Admitting: Internal Medicine

## 2021-08-10 ENCOUNTER — Ambulatory Visit (INDEPENDENT_AMBULATORY_CARE_PROVIDER_SITE_OTHER): Payer: BC Managed Care – PPO | Admitting: Internal Medicine

## 2021-08-10 ENCOUNTER — Other Ambulatory Visit: Payer: Self-pay

## 2021-08-10 VITALS — BP 124/80 | HR 60 | Ht 71.0 in | Wt 266.0 lb

## 2021-08-10 DIAGNOSIS — D352 Benign neoplasm of pituitary gland: Secondary | ICD-10-CM

## 2021-08-10 DIAGNOSIS — E291 Testicular hypofunction: Secondary | ICD-10-CM | POA: Diagnosis not present

## 2021-08-10 LAB — BASIC METABOLIC PANEL
BUN: 11 mg/dL (ref 6–23)
CO2: 28 mEq/L (ref 19–32)
Calcium: 10 mg/dL (ref 8.4–10.5)
Chloride: 104 mEq/L (ref 96–112)
Creatinine, Ser: 0.78 mg/dL (ref 0.40–1.50)
GFR: 126.9 mL/min (ref >60.00)
Glucose, Bld: 103 mg/dL — ABNORMAL HIGH (ref 70–99)
Potassium: 4.1 mEq/L (ref 3.5–5.1)
Sodium: 140 mEq/L (ref 135–145)

## 2021-08-10 LAB — TESTOSTERONE: Testosterone: 87.03 ng/dL — ABNORMAL LOW (ref 300.00–890.00)

## 2021-08-10 LAB — T4, FREE: Free T4: 0.67 ng/dL (ref 0.60–1.60)

## 2021-08-10 LAB — TSH: TSH: 3.63 u[IU]/mL (ref 0.35–5.50)

## 2021-08-10 MED ORDER — TESTOSTERONE 20.25 MG/ACT (1.62%) TD GEL: 81.0000 mg | Freq: Every day | TRANSDERMAL | 1 refills | Status: DC

## 2021-08-10 NOTE — Progress Notes (Signed)
Pituitary    Name: Travis Griffin  MRN/ DOB: [id number], 2001/08/10    Age/ Sex: 20 y.o., male    PCP: Patient, No Pcp Per (Inactive)   Reason for Endocrinology Evaluation: Pituitary Macroadenoma      Date of Initial Endocrinology Evaluation: 08/10/2021     HPI: Mr. Travis Griffin is a 20 y.o. male with a past medical history of Pituitary Adenoma . The patient presented for initial endocrinology clinic visit on 08/10/2021 for consultative assistance with his Pituitary Macroadenoma/Prolactinoma .     HISTORICAL SUMMARY:   He has been referred here by Dr. Jearl Klinefelter for further evaluation of prolactinoma. He as been  evaluated for pituitary macroadenoma at 5 cm in max diameter,  infiltrating the left cavernous sinus , extending into the posterior sphenoid sinuses especially the left extending into the left orbital apex , and also tracking posteriorly toward the left cerebellar tentorium. The mass completely engulfs the left ICA siphon which appears to remain patent , and also abuts most of the right ICA siphon.   Pt has been noted with suprasellar mass during ophthalmological examination for visual field defect  Prior to this he did endorse blurry vision and galactorrhea mainly from the right nipple for 6 yrs prior to his presentations  Denied headaches until started on Cabergoline which was started 04/2021    HYPOGONADISM HISTORY :  He was noted with physical features of prepubertal testosterone deficiency with sparse hair and small testicular size for age . This has been confirmed with scrotal ultrasound showing right testicular volume of 5.6 mL and left testicular size of 3.7 mL. Total Testerone was low at 58 ng/dL  No FH of endocrine disease or diabetes  Older brother with low sperm count , had to take testosterone and able to father biological children   SUBJECTIVE:    Today (08/10/2021): Travis Griffin is here for a follow up on pituitary macroadenoma and prolactinoma.     Headaches improved dramatically for the past 2 months  No vision changes since last visit  No change in hair growth since being on testosterone  He has noted improved libido and spontaneous erections  Denies nipple discharge  Denies skin  rash   HOME ENDOCRINE MEDICATIONS Cabergoline 0.5 mg, half a tablet twice a week  Testosterone gel 1 pump to each shoulder      HISTORY:  Past Medical History: No past medical history on file. Past Surgical History:  Past Surgical History:  Procedure Laterality Date   left testicular  surgery Left     Social History:  reports that he has never smoked. He has never used smokeless tobacco. He reports that he does not drink alcohol and does not use drugs. Family History: family history is not on file.   HOME MEDICATIONS: Allergies as of 08/10/2021       Reactions   Amoxicillin    Milk-related Compounds Diarrhea   Stomach cramps with milk and ice-cream   Penicillins Hives   Has patient had a PCN reaction causing immediate rash, facial/tongue/throat swelling, SOB or lightheadedness with hypotension: Unknown Has patient had a PCN reaction causing severe rash involving mucus membranes or skin necrosis: Unknown Has patient had a PCN reaction that required hospitalization: Unknown Has patient had a PCN reaction occurring within the last 10 years: Unknown If all of the above answers are "NO", then may proceed with Cephalosporin use.        Medication List  Accurate as of August 10, 2021  9:58 AM. If you have any questions, ask your nurse or doctor.          cabergoline 0.5 MG tablet Commonly known as: DOSTINEX Take 0.5 tablets (0.25 mg total) by mouth 2 (two) times a week.   fluticasone 50 MCG/ACT nasal spray Commonly known as: FLONASE Place 2 sprays into both nostrils daily.   ibuprofen 400 MG tablet Commonly known as: ADVIL Take 1 tablet (400 mg total) by mouth every 6 (six) hours as needed.   Testosterone 20.25  MG/ACT (1.62%) Gel Place 40.5 mg onto the skin daily in the afternoon.          REVIEW OF SYSTEMS: A comprehensive ROS was conducted with the patient and is negative except as per HPI     OBJECTIVE:  VS: BP 124/80 (BP Location: Left Arm, Patient Position: Sitting, Cuff Size: Large)   Pulse 60   Ht 5\' 11"  (1.803 m)   Wt 266 lb (120.7 kg)   SpO2 98%   BMI 37.10 kg/m    Wt Readings from Last 3 Encounters:  08/10/21 266 lb (120.7 kg)  05/07/21 258 lb 6.4 oz (117.2 kg)  05/10/18 240 lb (108.9 kg) (99 %, Z= 2.27)*   * Growth percentiles are based on CDC (Boys, 2-20 Years) data.     EXAM: General: Pt appears eunuchoid   Neck: General: Supple without adenopathy. Thyroid: Thyroid size normal.  No goiter or nodules appreciated.   Chest : Right breast gynecomastia at 10 O'clock   Lungs: Clear with good BS bilat with no rales, rhonchi, or wheezes  Heart: Auscultation: RRR.  Abdomen: Normoactive bowel sounds, soft, nontender, without masses or organomegaly palpable  Genital : Small penile size for age, undescended testicles B/L   Extremities:  BL LE: No pretibial edema normal ROM and strength.  Skin: Hair: Fine thin hair at the genital area, pt lacks androgen dependent hair growth ( no facial hair, abdomen, chest or back ) Skin Inspection: No rashes, no acne   Mental Status: Judgment, insight: Intact Orientation: Oriented to time, place, and person Mood and affect: No depression, anxiety, or agitation     DATA REVIEWED: Results for Travis Griffin, Travis Griffin (MRN [id number]) as of 08/13/2021 08:48  Ref. Range 08/10/2021 10:04  Prolactin Latest Ref Range: 2.0 - 18.0 ng/mL 25.5 (H)  Glucose Latest Ref Range: 70 - 99 mg/dL 103 (H)  Testosterone Latest Ref Range: 300.00 - 890.00 ng/dL 87.03 (L)  TSH Latest Ref Range: 0.35 - 5.50 uIU/mL 3.63  T4,Free(Direct) Latest Ref Range: 0.60 - 1.60 ng/dL 0.67     04/12/2021 @ 1045 AM FSH 4.3 LH 1.9 TSH 1.66 ACTH 38.3 Cortisol  14.4 Prolactin 513 ng/ML - Confirmed with Dilution  IGF-1  99 ng/mL ( 710-626)  MRI Brain 04/06/2021  Brain: No normal pituitary parenchyma is identified. Instead there is a large, lobulated and infiltrative skull base mass with heterogeneous enhancement occupying the sella turcica, continuing into the suprasellar cistern with mass effect on the undersurface of the brain (series 15, image 12), completely infiltrating the left cavernous sinus (series 16, image 11), extending into the posterior sphenoid sinuses especially the left (series 13, image 32), extending into the left orbital apex (series 13, image 44), and also tracking posteriorly toward the left cerebellar tentorium (series 13, image 54). The mass completely engulfs the left ICA siphon which appears to remain patent (series 15, image 13), and also abuts most of the right ICA siphon. The  mass the mass has a combination of smooth and highly lobulated margins, and has smoothly remodeled the left clivus as seen on series 2, image 12. There is partial effacement of the left prepontine cistern (series 7, image 7). The left Meckel's cave is partially infiltrated and/or compressed (series 16, image 8). The mass also involves the left foramen rotundum. All told, the lesion encompasses 50 by 44 by 49 mm (AP by transverse by CC). The lesion has isointense diffusion. SWI is limited for mineralization of the mass due to proximity to the skull base.   Severe mass effect on the optic chiasm and proximal left optic nerve, which cannot be delineated separate from the mass.   There is trace rightward midline shift but despite the mass effect and multi spatial involvement no cerebral edema is identified. No other abnormal intracranial enhancement is identified. No superimposed restricted diffusion to suggest acute infarction. No ventriculomegaly, extra-axial fluid collection or acute intracranial hemorrhage. Cervicomedullary junction within  normal limits. No encephalomalacia or chronic cerebral blood products identified.   Vascular: Major intracranial vascular flow voids are preserved, despite the large central skull base mass described above. There is mild mass effect on the left ICA siphon and left ICA terminus. Aside from the cavernous sinuses described above, the major dural venous sinuses are enhancing and appear to be patent.   Skull and upper cervical spine: Background bone marrow signal is within normal limits. 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   Sinuses/Orbits: Severe mass effect on the optic chiasm and left optic nerve to the orbital apex. The right orbital apex is relatively spared. Other intraorbital soft tissues appears symmetric and normal.   There is mild maxillary and ethmoid sinus mucosal thickening. As described above there is some tumor in the posterior sphenoid sinuses, especially the left.   Other: Mastoid air cells remain clear. Visible internal auditory structures appear normal. Negative visible scalp and face soft tissues.   IMPRESSION: 1. Large, lobulated and smoothly infiltrating extra-axial mass of the central skull base measures roughly 5 cm diameter, is eccentric to the left, and could be a large Pituitary Macroadenoma or a skull base Meningioma. Severe involvement of the sella turcica, left cavernous sinus, left ICA siphon, suprasellar cistern, and optic chiasm. Early involvement of the left orbital apex and prepontine cistern, as well as some tumor extension suspected into the posterior sphenoid sinuses, more so the left. Despite the mass effect, there is no cerebral edema. 2. Recommend Neurosurgery consultation. 3. 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     Scrotal Ultrasound 05/18/2021  Right testicle   Measurements: 3 x 1.7 x 2.1 cm. Volume = 5.6 mL. No mass or microlithiasis  visualized. Normal color flow.   Left testicle   Measurements: 2.1 x 1.4 x 2.4 cm. Volume = 3.7 mL. No mass or microlithiasis visualized. Normal color flow.   Right epididymis:  Normal in size and appearance.   Left epididymis:  Normal in size and appearance.   Hydrocele:  None visualized.   Varicocele:  None visualized.   IMPRESSION: Slightly high-riding appearance of the testicles.   Per chart there is a history of left testicular surgery, correlate for prior orchiopexy.   Grossly diminutive size of the testes for patient age.  ASSESSMENT/PLAN/RECOMMENDATIONS:   Pituitary Macroadenoma :   -Radiographically this is consistent with aggressive prolactinoma, but the reassuring factor so far is that he has responded well to cabergoline - Saw Dr. Zada Finders, (  Neurosurgery ) in June,2022  will continue with medical management at this time and perform short term MRI in the next few months  - He is also under the care of ophthalmology   2. Macroprolactinoma   - With a baseline prolactin level of > 500 at Neurosurgery  office  (confirmed with dilution) . On his initial visit to our clinic he was already on Cabergoline for ~ 10 days and repeat prolactin was down to 29  - Prolactin continues to trend down , I am not going to increase cabergoline at this time , due to headaches triggered by cabergoline and they just resolved, will re-assess dose after his next brain MRI   Medications : Continue Cabergoline 0.5 mg, Half a tablet TWICE weekly    3.Prepubertal Androgen Deficiency :  - This is secondary to Prolactinoma, as it appears this started around the age of 47 . Pt with eunuchoidism with poor muscle mass, lack of androgen dependant hair growth   - Testosterone level remains low, will increase as below  - he understands that if we are unable to reach therapeutic levels, will switch to injections    Medications  Increase testosterone to 2 pumps to each shoulder     F/U in  4 months   Signed electronically by: Mack Guise, MD  [facility] Endocrinology  [facility] [address] [place], [place] [address] Phone: [phone] FAX: [phone]   CC: Patient, No Pcp Per (Inactive) No address on file Phone: None Fax: None   Return to Endocrinology clinic as below: No future appointments.

## 2021-08-10 NOTE — Patient Instructions (Signed)
Continue Cabergoline 0.5 mg, half a tablet twice a week  Continue Testosterone 1 pump to each shoulder

## 2021-08-11 LAB — PROLACTIN: Prolactin: 25.5 ng/mL — ABNORMAL HIGH (ref 2.0–18.0)

## 2021-08-13 MED ORDER — CABERGOLINE 0.5 MG PO TABS: 0.2500 mg | ORAL_TABLET | ORAL | 2 refills | Status: DC

## 2021-12-14 ENCOUNTER — Other Ambulatory Visit: Payer: Self-pay

## 2021-12-14 ENCOUNTER — Encounter: Payer: Self-pay | Admitting: Internal Medicine

## 2021-12-14 ENCOUNTER — Ambulatory Visit (INDEPENDENT_AMBULATORY_CARE_PROVIDER_SITE_OTHER): Payer: BC Managed Care – PPO | Admitting: Internal Medicine

## 2021-12-14 VITALS — BP 122/76 | HR 86 | Ht 71.0 in | Wt 249.0 lb

## 2021-12-14 DIAGNOSIS — D352 Benign neoplasm of pituitary gland: Secondary | ICD-10-CM

## 2021-12-14 DIAGNOSIS — E291 Testicular hypofunction: Secondary | ICD-10-CM | POA: Diagnosis not present

## 2021-12-14 LAB — COMPREHENSIVE METABOLIC PANEL
ALT: 39 U/L (ref 0–53)
AST: 30 U/L (ref 0–37)
Albumin: 4.4 g/dL (ref 3.5–5.2)
Alkaline Phosphatase: 42 U/L (ref 39–117)
BUN: 18 mg/dL (ref 6–23)
CO2: 28 mEq/L (ref 19–32)
Calcium: 10.1 mg/dL (ref 8.4–10.5)
Chloride: 105 mEq/L (ref 96–112)
Creatinine, Ser: 0.9 mg/dL (ref 0.40–1.50)
GFR: 121.24 mL/min (ref >60.00)
Glucose, Bld: 90 mg/dL (ref 70–99)
Potassium: 3.9 mEq/L (ref 3.5–5.1)
Sodium: 142 mEq/L (ref 135–145)
Total Bilirubin: 1.1 mg/dL (ref 0.2–1.2)
Total Protein: 7.5 g/dL (ref 6.0–8.3)

## 2021-12-14 LAB — TESTOSTERONE: Testosterone: 92.45 ng/dL — ABNORMAL LOW (ref 300.00–890.00)

## 2021-12-14 MED ORDER — TESTOSTERONE CYPIONATE 200 MG/ML IJ SOLN: 200.0000 mg | INTRAMUSCULAR | 0 refills | Status: DC

## 2021-12-14 MED ORDER — SYRINGE 25G X 1-1/2" 3 ML MISC
1.0000 | Freq: Every day | 1 refills | Status: DC
Start: 2021-12-14 — End: 2023-01-29

## 2021-12-14 MED ORDER — CABERGOLINE 0.5 MG PO TABS: 0.2500 mg | ORAL_TABLET | ORAL | 2 refills | Status: DC

## 2021-12-14 MED ORDER — BD DISP NEEDLES 22G X 1-1/2" MISC
1.0000 | Freq: Every day | 1 refills | Status: DC
Start: 2021-12-14 — End: 2022-12-16

## 2021-12-14 NOTE — Progress Notes (Signed)
Pituitary    Name: Travis Griffin  MRN/ DOB: [id number], [birth year]    Age/ Sex: 21 y.o., male    PCP: Patient, No Pcp Per (Inactive)   Reason for Endocrinology Evaluation: Pituitary Macroadenoma      Date of Initial Endocrinology Evaluation: 12/14/2021     HPI: Mr. Travis Griffin is a 21 y.o. male with a past medical history of Pituitary Adenoma . The patient presented for initial endocrinology clinic visit on 12/14/2021 for consultative assistance with his Pituitary Macroadenoma/Prolactinoma .     HISTORICAL SUMMARY:   He has been referred here by Dr. Jearl Klinefelter for further evaluation of prolactinoma. He as been  evaluated for pituitary macroadenoma at 5 cm in max diameter,  infiltrating the left cavernous sinus , extending into the posterior sphenoid sinuses especially the left extending into the left orbital apex , and also tracking posteriorly toward the left cerebellar tentorium. The mass completely engulfs the left ICA siphon which appears to remain patent , and also abuts most of the right ICA siphon.   Pt has been noted with suprasellar mass during ophthalmological examination for visual field defect  Prior to this he did endorse blurry vision and galactorrhea mainly from the right nipple for 6 yrs prior to his presentations  Denied headaches until started on Cabergoline which was started 04/2021    HYPOGONADISM HISTORY :  He was noted with physical features of prepubertal testosterone deficiency with sparse hair and small testicular size for age . This has been confirmed with scrotal ultrasound showing right testicular volume of 5.6 mL and left testicular size of 3.7 mL. Total Testerone was low at 58 ng/dL  No FH of endocrine disease or diabetes  Older brother with low sperm count , had to take testosterone and able to father biological children   SUBJECTIVE:    Today (08/10/2021): Travis Griffin is here for a follow up on pituitary macroadenoma and prolactinoma.    Pt has been noted with weight loss  Headaches have resolved  Has ran out of Testosterone last week  No vision changes  Has noted  in hair growth since being on testosterone  Denies nipple discharge    HOME ENDOCRINE MEDICATIONS Cabergoline 0.5 mg, half a tablet twice a week  Testosterone gel 2 pump to each shoulder      HISTORY:  Past Medical History: No past medical history on file. Past Surgical History:  Past Surgical History:  Procedure Laterality Date   left testicular  surgery Left     Social History:  reports that he has never smoked. He has never used smokeless tobacco. He reports that he does not drink alcohol and does not use drugs. Family History: family history is not on file.   HOME MEDICATIONS: Allergies as of 12/14/2021       Reactions   Amoxicillin    Milk-related Compounds Diarrhea   Stomach cramps with milk and ice-cream   Penicillins Hives   Has patient had a PCN reaction causing immediate rash, facial/tongue/throat swelling, SOB or lightheadedness with hypotension: Unknown Has patient had a PCN reaction causing severe rash involving mucus membranes or skin necrosis: Unknown Has patient had a PCN reaction that required hospitalization: Unknown Has patient had a PCN reaction occurring within the last 10 years: Unknown If all of the above answers are "NO", then may proceed with Cephalosporin use.        Medication List        Accurate as of December 14, 2021  2:43 PM. If you have any questions, ask your nurse or doctor.          BD Disp Needles 22G X 1-1/2" Misc Generic drug: NEEDLE (DISP) 22 G 1 Device by Does not apply route daily in the afternoon. Started by: Dorita Sciara, MD   cabergoline 0.5 MG tablet Commonly known as: DOSTINEX Take 0.5 tablets (0.25 mg total) by mouth 2 (two) times a week. Start taking on: December 17, 2021   fluticasone 50 MCG/ACT nasal spray Commonly known as: FLONASE Place 2 sprays into both nostrils  daily.   ibuprofen 400 MG tablet Commonly known as: ADVIL Take 1 tablet (400 mg total) by mouth every 6 (six) hours as needed.   SYRINGE 3CC/25GX1-1/2" 25G X 1-1/2" 3 ML Misc 1 Device by Does not apply route daily in the afternoon. Started by: Dorita Sciara, MD   Testosterone 20.25 MG/ACT (1.62%) Gel Place 81 mg onto the skin daily in the afternoon.   Testosterone Cypionate 200 MG/ML Soln Inject 200 mg as directed every 14 (fourteen) days. Started by: Dorita Sciara, MD          REVIEW OF SYSTEMS: A comprehensive ROS was conducted with the patient and is negative except as per HPI     OBJECTIVE:  VS: BP 122/76 (BP Location: Left Arm, Patient Position: Sitting, Cuff Size: Large)    Pulse 86    Ht 5\' 11"  (1.803 m)    Wt 249 lb (112.9 kg)    SpO2 99%    BMI 34.73 kg/m    Wt Readings from Last 3 Encounters:  12/14/21 249 lb (112.9 kg)  08/10/21 266 lb (120.7 kg)  05/07/21 258 lb 6.4 oz (117.2 kg)     EXAM: General: Pt appears eunuchoid   Neck: General: Supple without adenopathy. Thyroid: Thyroid size normal.  No goiter or nodules appreciated.   Chest : Right breast gynecomastia at 10 O'clock   Lungs: Clear with good BS bilat with no rales, rhonchi, or wheezes  Heart: Auscultation: RRR.  Abdomen: Normoactive bowel sounds, soft, nontender, without masses or organomegaly palpable  Genital : Small penile size for age, undescended testicles B/L   Extremities:  BL LE: No pretibial edema normal ROM and strength.  Skin: Hair: Fine thin hair at the genital area, pt lacks androgen dependent hair growth ( no facial hair, abdomen, chest or back ) Skin Inspection: No rashes, no acne   Mental Status: Judgment, insight: Intact Orientation: Oriented to time, place, and person Mood and affect: No depression, anxiety, or agitation     DATA REVIEWED:  Latest Reference Range & Units 12/14/21 09:49  Prolactin 2.0 - 18.0 ng/mL 63.1 (H)  (H): Data is abnormally  high    04/12/2021 @ 1045 AM FSH 4.3 LH 1.9 TSH 1.66 ACTH 38.3 Cortisol 14.4 Prolactin 513 ng/ML - Confirmed with Dilution  IGF-1  99 ng/mL ( [id number])  MRI Brain 04/06/2021  Brain: No normal pituitary parenchyma is identified. Instead there is a large, lobulated and infiltrative skull base mass with heterogeneous enhancement occupying the sella turcica, continuing into the suprasellar cistern with mass effect on the undersurface of the brain (series 15, image 12), completely infiltrating the left cavernous sinus (series 16, image 11), extending into the posterior sphenoid sinuses especially the left (series 13, image 32), extending into the left orbital apex (series 13, image 44), and also tracking posteriorly toward the left cerebellar tentorium (series 13, image 54). The mass completely engulfs the  left ICA siphon which appears to remain patent (series 15, image 13), and also abuts most of the right ICA siphon. The mass the mass has a combination of smooth and highly lobulated margins, and has smoothly remodeled the left clivus as seen on series 2, image 12. There is partial effacement of the left prepontine cistern (series 7, image 7). The left Meckel's cave is partially infiltrated and/or compressed (series 16, image 8). The mass also involves the left foramen rotundum. All told, the lesion encompasses 50 by 44 by 49 mm (AP by transverse by CC). The lesion has isointense diffusion. SWI is limited for mineralization of the mass due to proximity to the skull base.   Severe mass effect on the optic chiasm and proximal left optic nerve, which cannot be delineated separate from the mass.   There is trace rightward midline shift but despite the mass effect and multi spatial involvement no cerebral edema is identified. No other abnormal intracranial enhancement is identified. No superimposed restricted diffusion to suggest acute infarction. No ventriculomegaly, extra-axial fluid  collection or acute intracranial hemorrhage. Cervicomedullary junction within normal limits. No encephalomalacia or chronic cerebral blood products identified.   Vascular: Major intracranial vascular flow voids are preserved, despite the large central skull base mass described above. There is mild mass effect on the left ICA siphon and left ICA terminus. Aside from the cavernous sinuses described above, the major dural venous sinuses are enhancing and appear to be patent.   Skull and upper cervical spine: Background bone marrow signal is within normal limits. 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   Sinuses/Orbits: Severe mass effect on the optic chiasm and left optic nerve to the orbital apex. The right orbital apex is relatively spared. Other intraorbital soft tissues appears symmetric and normal.   There is mild maxillary and ethmoid sinus mucosal thickening. As described above there is some tumor in the posterior sphenoid sinuses, especially the left.   Other: Mastoid air cells remain clear. Visible internal auditory structures appear normal. Negative visible scalp and face soft tissues.   IMPRESSION: 1. Large, lobulated and smoothly infiltrating extra-axial mass of the central skull base measures roughly 5 cm diameter, is eccentric to the left, and could be a large Pituitary Macroadenoma or a skull base Meningioma. Severe involvement of the sella turcica, left cavernous sinus, left ICA siphon, suprasellar cistern, and optic chiasm. Early involvement of the left orbital apex and prepontine cistern, as well as some tumor extension suspected into the posterior sphenoid sinuses, more so the left. Despite the mass effect, there is no cerebral edema. 2. Recommend Neurosurgery consultation. 3. 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     Scrotal Ultrasound 05/18/2021  Right testicle    Measurements: 3 x 1.7 x 2.1 cm. Volume = 5.6 mL. No mass or microlithiasis visualized. Normal color flow.   Left testicle   Measurements: 2.1 x 1.4 x 2.4 cm. Volume = 3.7 mL. No mass or microlithiasis visualized. Normal color flow.   Right epididymis:  Normal in size and appearance.   Left epididymis:  Normal in size and appearance.   Hydrocele:  None visualized.   Varicocele:  None visualized.   IMPRESSION: Slightly high-riding appearance of the testicles.   Per chart there is a history of left testicular surgery, correlate for prior orchiopexy.   Grossly diminutive size of the testes for patient age.  ASSESSMENT/PLAN/RECOMMENDATIONS:   Pituitary Macroadenoma :   -Radiographically this is  consistent with aggressive prolactinoma, but the reassuring factor so far is that he has responded well to cabergoline - Saw Dr. Zada Finders, ( Neurosurgery ) in June,2022  will continue with medical management at this time , we will proceed with MRI of the pituitary gland - He is also under the care of ophthalmology   2. Macroprolactinoma   - With a baseline prolactin level of > 500 at Neurosurgery  office  (confirmed with dilution) . On his initial visit to our clinic he was already on Cabergoline for ~ 10 days and repeat prolactin was down to 29  -Prolactin levels have trended up, will increase cabergoline as below  Medications : Increase cabergoline 0.5 mg, 1 tablet TWICE weekly    3.Prepubertal Androgen Deficiency :  - This is secondary to Prolactinoma, as it appears this started around the age of 44 .  -Despite increasing testosterone to 4 pumps daily, hair growth has minimally increased, he is also having issues with insurance coverage -He is in agreement of starting IM injection of testosterone, he was trained on injection technique today -We will switch as below   Medications  Start testosterone cypionate 200 mg q. 14 days    F/U in 3 months   Signed electronically  by: Mack Guise, MD  [facility] End[facility]  [address] [place], [place] [address] Phone: [phone] FAX: [phone]   CC: Patient, No Pcp Per (Inactive) No address on file Phone: None Fax: None   Return to Endocrinology clinic as below: Future Appointments  Date Time Provider [place]  12/30/2021  4:00 PM [facility]-315 MR 1 [id number] [facility] [address] [facility]  03/22/2022  9:10 AM Tauni Sanks, Melanie Crazier, MD [id number] None

## 2021-12-14 NOTE — Patient Instructions (Signed)
Continue Cabergoline 0.5 mg, half a tablet twice a week  ?Start  Testosterone Cypionate  200 mg EVERY 14 days  ?

## 2021-12-15 LAB — PROLACTIN: Prolactin: 63.1 ng/mL — ABNORMAL HIGH (ref 2.0–18.0)

## 2021-12-17 ENCOUNTER — Encounter: Payer: Self-pay | Admitting: Internal Medicine

## 2021-12-17 MED ORDER — CABERGOLINE 0.5 MG PO TABS
0.5000 mg | ORAL_TABLET | ORAL | 3 refills | Status: DC
Start: 2021-12-17 — End: 2022-03-25

## 2021-12-30 ENCOUNTER — Ambulatory Visit
Admission: RE | Admit: 2021-12-30 | Discharge: 2021-12-30 | Disposition: A | Discharge status: Home / Self Care | Payer: BC Managed Care – PPO | Source: Ambulatory Visit | Attending: Internal Medicine | Admitting: Internal Medicine

## 2021-12-30 ENCOUNTER — Other Ambulatory Visit: Payer: Self-pay

## 2021-12-30 DIAGNOSIS — R220 Localized swelling, mass and lump, head: Secondary | ICD-10-CM | POA: Diagnosis not present

## 2021-12-30 DIAGNOSIS — C719 Malignant neoplasm of brain, unspecified: Secondary | ICD-10-CM | POA: Diagnosis not present

## 2021-12-30 DIAGNOSIS — Z86018 Personal history of other benign neoplasm: Secondary | ICD-10-CM | POA: Diagnosis not present

## 2021-12-30 DIAGNOSIS — D492 Neoplasm of unspecified behavior of bone, soft tissue, and skin: Secondary | ICD-10-CM | POA: Diagnosis not present

## 2021-12-30 DIAGNOSIS — D352 Benign neoplasm of pituitary gland: Secondary | ICD-10-CM

## 2021-12-30 IMAGING — MR MR HEAD WO/W CM
12 of 20 series · 28 of 48 positions shown · IV contrast (10 ml Multihance)
Comparison: [DATE].

CLINICAL DATA: Brain/CNS neoplasm, monitor Prolactinoma follow up

EXAM:
MRI HEAD WITHOUT AND WITH CONTRAST
TECHNIQUE: Multiplanar, multiecho pulse sequences of the brain and surrounding
structures were obtained without and with intravenous contrast.
CONTRAST:  10mL MULTIHANCE GADOBENATE DIMEGLUMINE 529 MG/ML IV SOLN

[Series 2: t1_se_sag · sagittal · 5.0mm · 0.45mm/px · 3 of 24 slices shown]
[im 1/24]
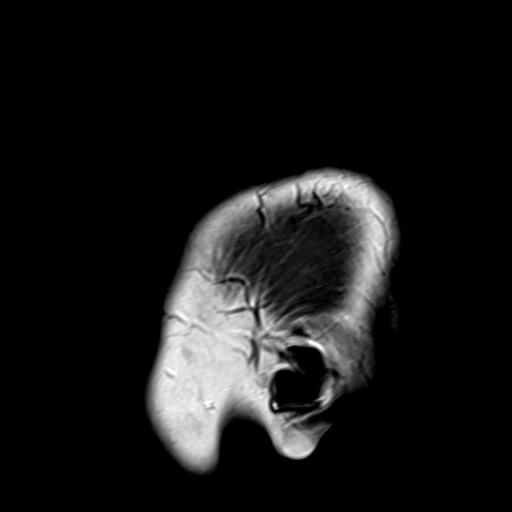
[im 12/24]
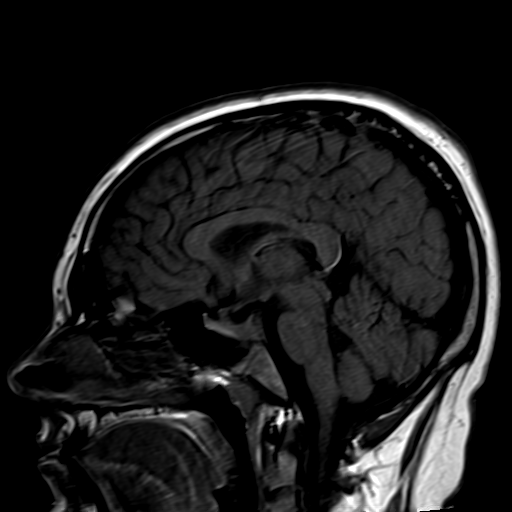
[im 24/24]
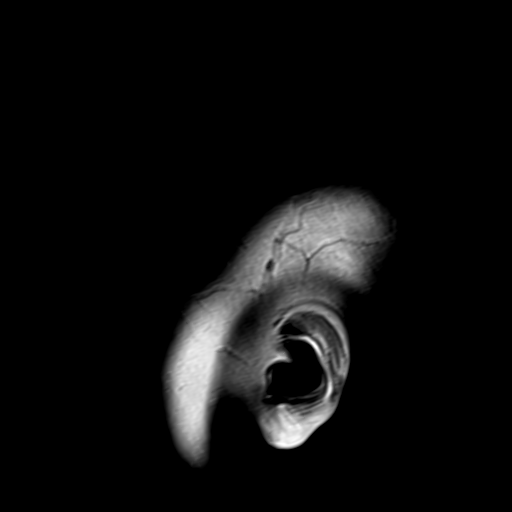

[Series 3: ep2d_diff_3 · axial · 3.0mm · 1.80mm/px · z∈[-67,-20]mm · 3 of 100 slices shown]
[im 1/100]
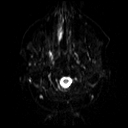
[im 12/100]
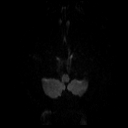
[im 34/100]
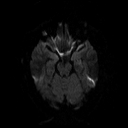

[Series 5: T2 · axial · 5.0mm · 0.45mm/px · z∈[-68,+80]mm · 3 of 24 slices shown (1 of 2)]
[im 1/24]
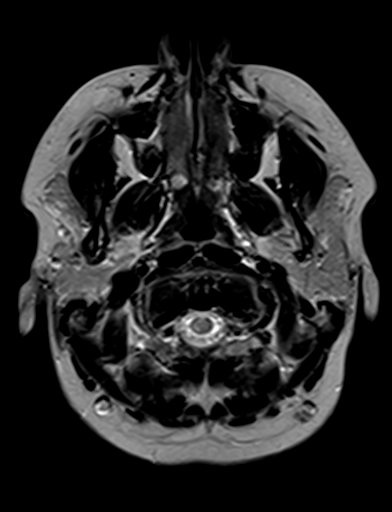
[im 12/24]
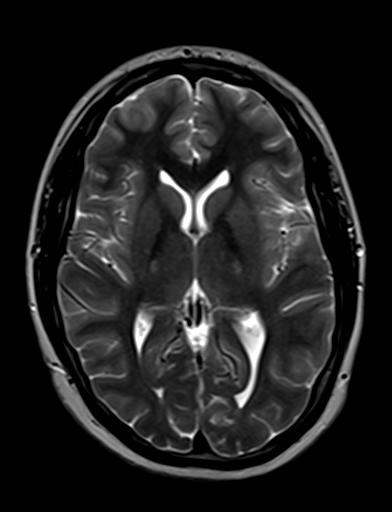
[im 24/24]
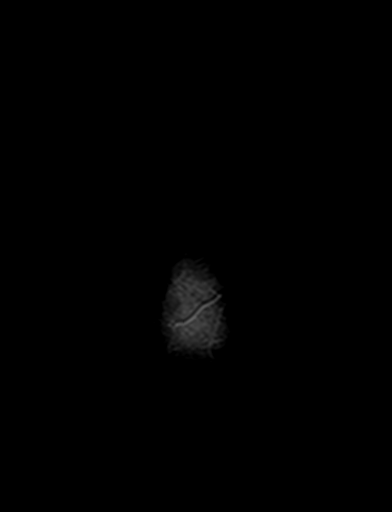

[Series 6: GRE · axial · 5.0mm · 0.45mm/px · z∈[-68,+80]mm · 2 of 24 slices shown]
[im 1/24]
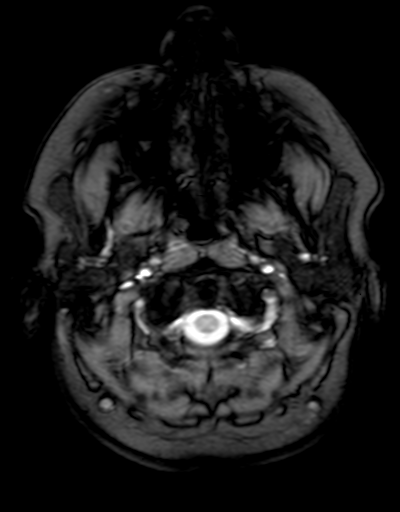
[im 24/24]
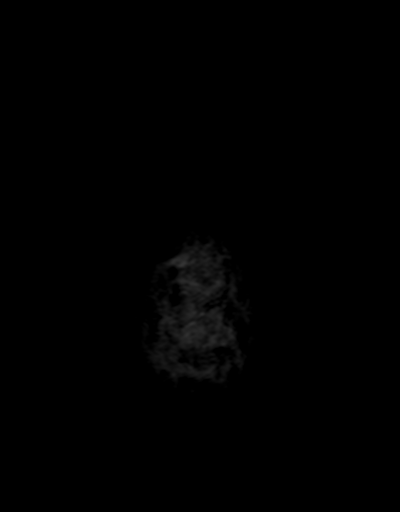

[Series 7: FLAIR · axial · 5.0mm · 0.45mm/px · z∈[-71,+84]mm · 2 of 25 slices shown]
[im 1/25]
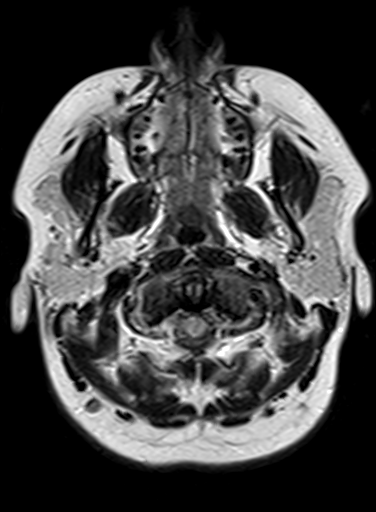
[im 25/25]
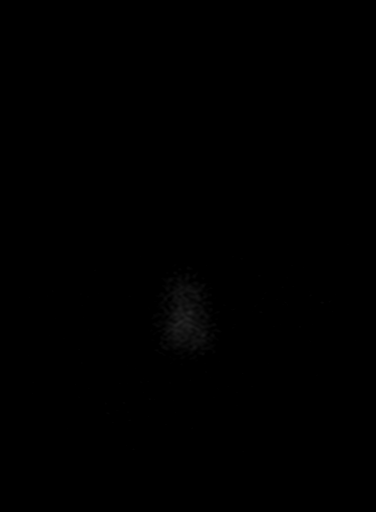

[Series 8: T1 · sagittal · 3.0mm · 0.35mm/px · 1 of 11 slices shown (1 of 2)]
[im 1/11]
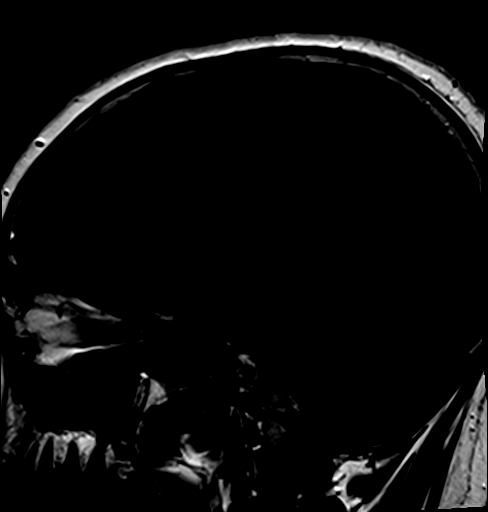

[Series 9: T1 · coronal · 3.0mm · 0.35mm/px · 1 of 14 slices shown (2 of 2)]
[im 1/14]
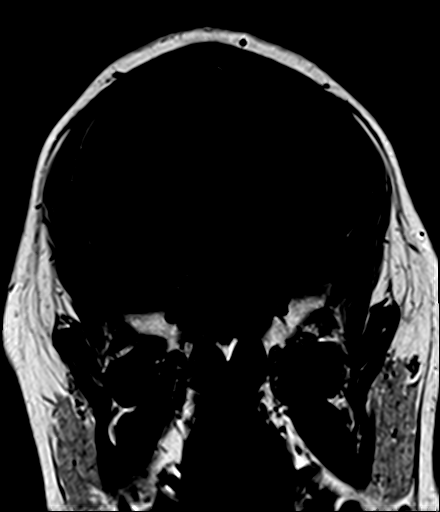

[Series 10: T2 · coronal · 3.0mm · 0.40mm/px · 1 of 14 slices shown (2 of 2)]
[im 1/14]
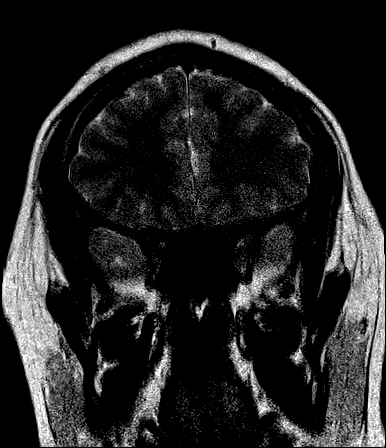

[Series 18: T1 post-contrast · coronal · 3.0mm · 0.35mm/px · 1 of 14 slices shown (1 of 4)]
[im 1/14]
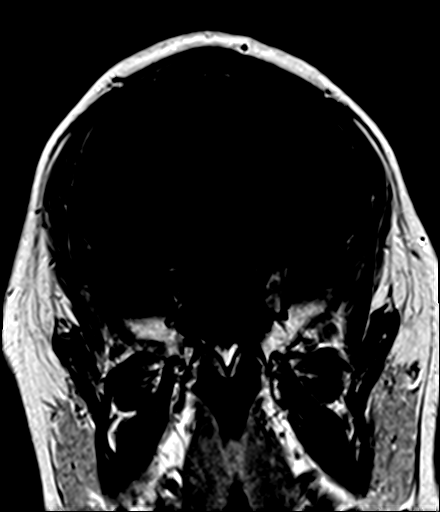

[Series 19: T1 post-contrast · sagittal · 3.0mm · 0.35mm/px · 1 of 11 slices shown (2 of 4)]
[im 1/11]
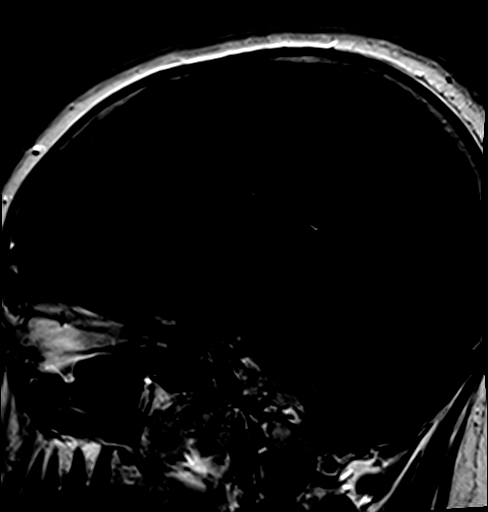

[Series 20: T1 post-contrast · axial · 2.0mm · 0.45mm/px · z∈[-73,+84]mm · 7 of 80 slices shown (3 of 4)]
[im 1/80]
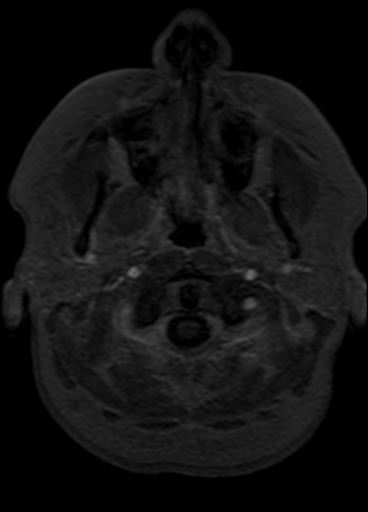
[im 14/80]
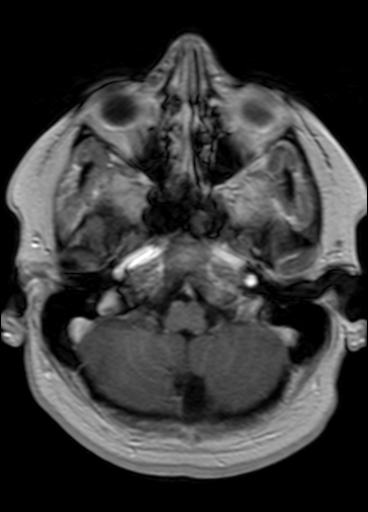
[im 27/80]
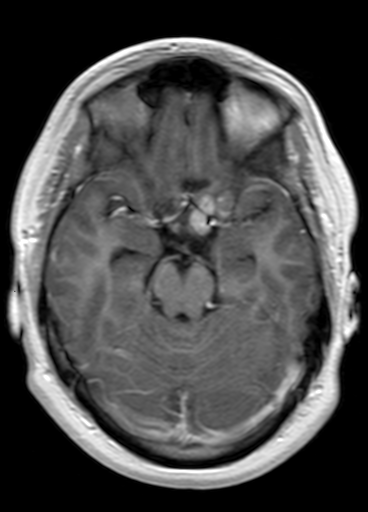
[im 40/80]
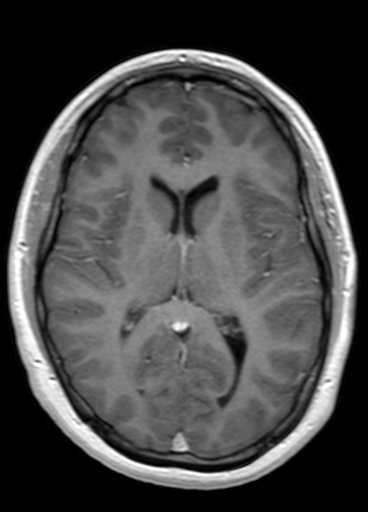
[im 53/80]
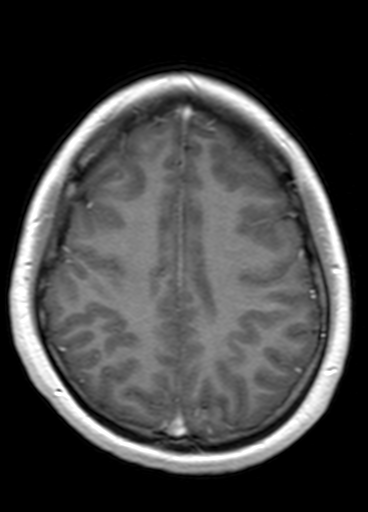
[im 66/80]
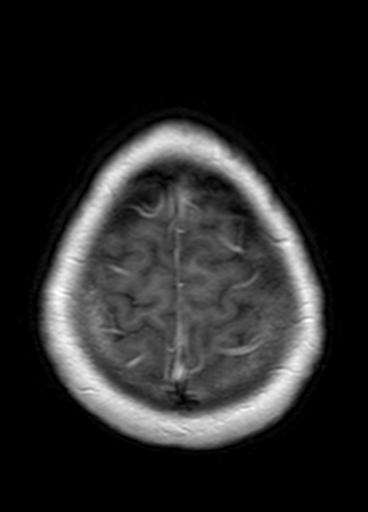
[im 80/80]
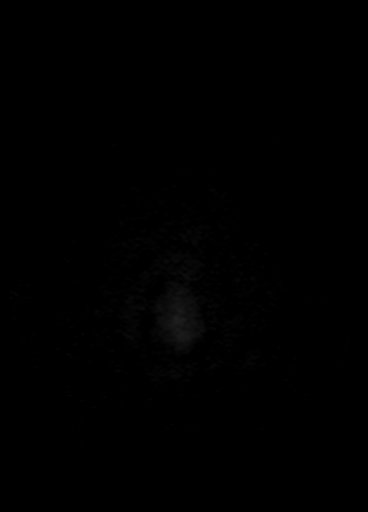

[Series 21: T1 post-contrast · coronal · 5.0mm · 0.69mm/px · 3 of 34 slices shown (4 of 4)]
[im 1/34]
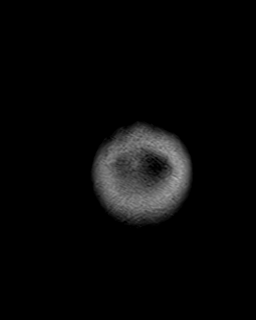
[im 17/34]
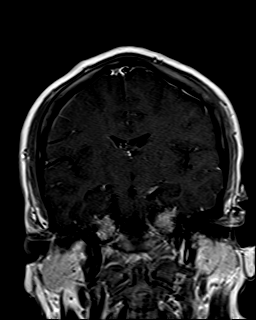
[im 34/34]
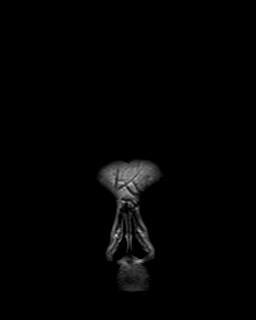

[28 of 48 positions shown; findings below may reference images not displayed]

FINDINGS: Brain: Decreased size/bulk of a homogeneously enhancing lobulated
infiltrating sellar and suprasellar mass, now measuring up to
approximately 3.3 x 3.1 x 3.3 cm. Decreased mass effect on the
adjacent surface of the brain anteriorly and decreased extension
into the adjacent sphenoid sinuses. There is persistent left greater
than right cavernous sinus invasion with continued complete in
circumferential in case mint of the left cavernous ICA and abutment
the right cavernous ICA. Posterior extension to the left maxilla
cave. Anteriorly, the mass approaches and encroaches on the left
superior orbital fissure and orbital apex without definite
intraorbital extension.

No evidence of acute hemorrhage, acute infarct, hydrocephalus,
midline shift, or extra-axial fluid collection.

Vascular: Major arterial flow voids are maintained at the skull
base.

Skull and upper cervical spine: Normal marrow signal.

Sinuses/Orbits: Persistent, but decreased mass effect on the optic
chiasm by the mass. Mass extensive left orbital apex, as detailed
above. Decreased extension of tumor into the sphenoid sinuses.
Otherwise, sinuses are largely clear.

Other: No mastoid effusions.
IMPRESSION: Decreased bulk/size of a large sellar/suprasellar mass, described
above.

## 2021-12-30 MED ORDER — GADOBENATE DIMEGLUMINE 529 MG/ML IV SOLN
10.0000 mL | Freq: Once | INTRAVENOUS | Status: AC | PRN
  Administered 2021-12-30: 10 mL via INTRAVENOUS

## 2022-03-22 ENCOUNTER — Encounter: Payer: Self-pay | Admitting: Internal Medicine

## 2022-03-22 ENCOUNTER — Ambulatory Visit (INDEPENDENT_AMBULATORY_CARE_PROVIDER_SITE_OTHER): Payer: BC Managed Care – PPO | Admitting: Internal Medicine

## 2022-03-22 VITALS — BP 128/76 | HR 49 | Ht 71.0 in | Wt 261.2 lb

## 2022-03-22 DIAGNOSIS — R5382 Chronic fatigue, unspecified: Secondary | ICD-10-CM

## 2022-03-22 DIAGNOSIS — E291 Testicular hypofunction: Secondary | ICD-10-CM

## 2022-03-22 DIAGNOSIS — D352 Benign neoplasm of pituitary gland: Secondary | ICD-10-CM

## 2022-03-22 LAB — CBC
HCT: 44.3 % (ref 39.0–52.0)
Hemoglobin: 14.7 g/dL (ref 13.0–17.0)
MCHC: 33.2 g/dL (ref 30.0–36.0)
MCV: 84.4 fl (ref 78.0–100.0)
Platelets: 248 10*3/uL (ref 150.0–400.0)
RBC: 5.25 Mil/uL (ref 4.22–5.81)
RDW: 13.3 % (ref 11.5–15.5)
WBC: 8 10*3/uL (ref 4.0–10.5)

## 2022-03-22 LAB — COMPREHENSIVE METABOLIC PANEL
ALT: 23 U/L (ref 0–53)
AST: 19 U/L (ref 0–37)
Albumin: 4.5 g/dL (ref 3.5–5.2)
Alkaline Phosphatase: 46 U/L (ref 39–117)
BUN: 15 mg/dL (ref 6–23)
CO2: 28 mEq/L (ref 19–32)
Calcium: 10.1 mg/dL (ref 8.4–10.5)
Chloride: 104 mEq/L (ref 96–112)
Creatinine, Ser: 0.76 mg/dL (ref 0.40–1.50)
GFR: 127.35 mL/min (ref >60.00)
Glucose, Bld: 87 mg/dL (ref 70–99)
Potassium: 3.8 mEq/L (ref 3.5–5.1)
Sodium: 139 mEq/L (ref 135–145)
Total Bilirubin: 1 mg/dL (ref 0.2–1.2)
Total Protein: 7.3 g/dL (ref 6.0–8.3)

## 2022-03-22 LAB — TSH: TSH: 4.68 u[IU]/mL (ref 0.35–5.50)

## 2022-03-22 LAB — CORTISOL: Cortisol, Plasma: 13.4 ug/dL

## 2022-03-22 LAB — TESTOSTERONE: Testosterone: 90.96 ng/dL — ABNORMAL LOW (ref 300.00–890.00)

## 2022-03-22 LAB — LUTEINIZING HORMONE: LH: 0.04 m[IU]/mL — ABNORMAL LOW (ref 1.50–9.30)

## 2022-03-22 LAB — T4, FREE: Free T4: 0.63 ng/dL (ref 0.60–1.60)

## 2022-03-22 MED ORDER — TESTOSTERONE CYPIONATE 200 MG/ML IJ SOLN: 150.0000 mg | INTRAMUSCULAR | 1 refills | Status: DC

## 2022-03-22 MED ORDER — TESTOSTERONE CYPIONATE 200 MG/ML IJ SOLN
200.0000 mg | INTRAMUSCULAR | 1 refills | Status: DC
Start: 2022-03-22 — End: 2022-03-22

## 2022-03-22 NOTE — Patient Instructions (Signed)

## 2022-03-22 NOTE — Progress Notes (Unsigned)
Pituitary    Name: Travis Griffin  MRN/ DOB: [id number], 2001/09/14    Age/ Sex: 21 y.o., male    PCP: Patient, No Pcp Per (Inactive)   Reason for Endocrinology Evaluation: Pituitary Macroadenoma      Date of Initial Endocrinology Evaluation: 05/07/2021    HPI: Travis Griffin is a 21 y.o. male with a past medical history of Pituitary Adenoma . The patient presented for initial endocrinology clinic visit on 05/07/2021  for consultative assistance with his Pituitary Macroadenoma/Prolactinoma .     HISTORICAL SUMMARY:   He has been referred here by Dr. Jearl Klinefelter for further evaluation of prolactinoma. He as been  evaluated for pituitary macroadenoma at 5 cm in max diameter,  infiltrating the left cavernous sinus , extending into the posterior sphenoid sinuses especially the left extending into the left orbital apex , and also tracking posteriorly toward the left cerebellar tentorium. The mass completely engulfs the left ICA siphon which appears to remain patent , and also abuts most of the right ICA siphon.   Pt has been noted with suprasellar mass during ophthalmological examination for visual field defect  Prior to this he did endorse blurry vision and galactorrhea mainly from the right nipple for 6 yrs prior to his presentations  Denied headaches until started on Cabergoline which was started 04/2021    Repeat brain MRI in March 2023 showed decrease in pituitary macroadenoma size from 5 cm to ~ 3 cm.   HYPOGONADISM HISTORY :  He was noted with physical features of prepubertal testosterone deficiency with sparse hair and small testicular size for age . This has been confirmed with scrotal ultrasound showing right testicular volume of 5.6 mL and left testicular size of 3.7 mL. Total Testerone was low at 58 ng/dL  No FH of endocrine disease or diabetes  Older brother with low sperm count , had to take testosterone and able to father biological children   Testosterone  replacement therapy started 05/2021, we initially started him on topical testosterone placement but due to insurance issues this has been switched to IM injections   SUBJECTIVE:    Today (08/10/2021): Travis Griffin is here for a follow up on pituitary macroadenoma, prolactinoma and hypogonadism    Pt has been noted with weight gain  Occasional headaches  No vision changes  Has been tired , un-refreshing sleep  No  voice changes  No worsening acne  He noted new hair growth on chest and legs  Has been snoring at night that he attributes to sinus issues  Denies nipple discharge    Last dose was 5 days ago    HOME ENDOCRINE MEDICATIONS Cabergoline 0.5 mg, 1  tablet twice a week  Testosterone cypionate 200 mg  (1 mL ) every 14 days     HISTORY:  Past Medical History: No past medical history on file. Past Surgical History:  Past Surgical History:  Procedure Laterality Date   left testicular  surgery Left     Social History:  reports that he has never smoked. He has never used smokeless tobacco. He reports that he does not drink alcohol and does not use drugs. Family History: family history is not on file.   HOME MEDICATIONS: Allergies as of 03/22/2022       Reactions   Amoxicillin    Milk-related Compounds Diarrhea   Stomach cramps with milk and ice-cream   Penicillins Hives   Has patient had a PCN reaction causing immediate rash, facial/tongue/throat swelling,  SOB or lightheadedness with hypotension: Unknown Has patient had a PCN reaction causing severe rash involving mucus membranes or skin necrosis: Unknown Has patient had a PCN reaction that required hospitalization: Unknown Has patient had a PCN reaction occurring within the last 10 years: Unknown If all of the above answers are "NO", then may proceed with Cephalosporin use.        Medication List        Accurate as of March 22, 2022  9:02 AM. If you have any questions, ask your nurse or doctor.           BD Disp Needles 22G X 1-1/2" Misc Generic drug: NEEDLE (DISP) 22 G 1 Device by Does not apply route daily in the afternoon.   cabergoline 0.5 MG tablet Commonly known as: DOSTINEX Take 1 tablet (0.5 mg total) by mouth 2 (two) times a week.   fluticasone 50 MCG/ACT nasal spray Commonly known as: FLONASE Place 2 sprays into both nostrils daily.   ibuprofen 400 MG tablet Commonly known as: ADVIL Take 1 tablet (400 mg total) by mouth every 6 (six) hours as needed.   SYRINGE 3CC/25GX1-1/2" 25G X 1-1/2" 3 ML Misc 1 Device by Does not apply route daily in the afternoon.   Testosterone 20.25 MG/ACT (1.62%) Gel Place 81 mg onto the skin daily in the afternoon.   Testosterone Cypionate 200 MG/ML Soln Inject 200 mg as directed every 14 (fourteen) days.          REVIEW OF SYSTEMS: A comprehensive ROS was conducted with the patient and is negative except as per HPI     OBJECTIVE:  VS: BP 128/76 (BP Location: Left Arm, Patient Position: Sitting, Cuff Size: Normal)   Pulse (!) 49   Ht '5\' 11"'$  (1.803 m)   Wt 261 lb 3.2 oz (118.5 kg)   SpO2 99%   BMI 36.43 kg/m    Wt Readings from Last 3 Encounters:  03/22/22 261 lb 3.2 oz (118.5 kg)  12/14/21 249 lb (112.9 kg)  08/10/21 266 lb (120.7 kg)     EXAM: General: Pt appears eunuchoid   Neck: General: Supple without adenopathy. Thyroid: Thyroid size normal.  No goiter or nodules appreciated.   Lungs: Clear with good BS bilat with no rales, rhonchi, or wheezes  Heart: Auscultation: RRR.  Abdomen: Normoactive bowel sounds, soft, nontender, without masses or organomegaly palpable  Extremities:  BL LE: No pretibial edema normal ROM and strength.  Skin: Hair: scatted closed comedones on the right cheek , fine with thick scattered hair on face and torso  Fine right abdominal quadrate striae  Skin Inspection: No rashes   Mental Status: Judgment, insight: Intact Orientation: Oriented to time, place, and person Mood and affect: No  depression, anxiety, or agitation     DATA REVIEWED: ***   04/12/2021 @ 1045 AM FSH 4.3 LH 1.9 TSH 1.66 ACTH 38.3 Cortisol 14.4 Prolactin 513 ng/ML - Confirmed with Dilution  IGF-1  99 ng/mL ( 250-539)  MRI Brain 04/06/2021  FINDINGS: Brain: Decreased size/bulk of a homogeneously enhancing lobulated infiltrating sellar and suprasellar mass, now measuring up to approximately 3.3 x 3.1 x 3.3 cm. Decreased mass effect on the adjacent surface of the brain anteriorly and decreased extension into the adjacent sphenoid sinuses. There is persistent left greater than right cavernous sinus invasion with continued complete in circumferential in case mint of the left cavernous ICA and abutment the right cavernous ICA. Posterior extension to the left maxilla cave. Anteriorly, the mass approaches  and encroaches on the left superior orbital fissure and orbital apex without definite intraorbital extension.   No evidence of acute hemorrhage, acute infarct, hydrocephalus, midline shift, or extra-axial fluid collection.   Vascular: Major arterial flow voids are maintained at the skull base.   Skull and upper cervical spine: Normal marrow signal.   Sinuses/Orbits: Persistent, but decreased mass effect on the optic chiasm by the mass. Mass extensive left orbital apex, as detailed above. Decreased extension of tumor into the sphenoid sinuses. Otherwise, sinuses are largely clear.   Other: No mastoid effusions.   IMPRESSION: Decreased bulk/size of a large sellar/suprasellar mass, described above.      Scrotal Ultrasound 05/18/2021  Right testicle   Measurements: 3 x 1.7 x 2.1 cm. Volume = 5.6 mL. No mass or microlithiasis visualized. Normal color flow.   Left testicle   Measurements: 2.1 x 1.4 x 2.4 cm. Volume = 3.7 mL. No mass or microlithiasis visualized. Normal color flow.   Right epididymis:  Normal in size and appearance.   Left epididymis:  Normal in size and  appearance.   Hydrocele:  None visualized.   Varicocele:  None visualized.   IMPRESSION: Slightly high-riding appearance of the testicles.   Per chart there is a history of left testicular surgery, correlate for prior orchiopexy.   Grossly diminutive size of the testes for patient age.  ASSESSMENT/PLAN/RECOMMENDATIONS:   Pituitary Macroadenoma :   -Radiographically this is consistent with aggressive prolactinoma, but the reassuring factor so far is that he has responded well to cabergoline - Saw Dr. Zada Finders, ( Neurosurgery ) in June,2022  will continue with medical management at this time ,  -Repeat brain MRI 12/2021 showed decrease in adenoma size from 5 cm to 3 cm - He is also under the care of ophthalmology  - Will proceed with pituitary hormonal testing today but will also screen for cushing syndrome given weight gain and despite fine but pale violaceous abdominal striae   2. Macroprolactinoma   - With a baseline prolactin level of > 500 at Neurosurgery  office  (confirmed with dilution) . On his initial visit to our clinic he was already on Cabergoline for ~ 10 days and repeat prolactin was down to 29  -Prolactin ***  Medications :  cabergoline 0.5 mg, 1 tablet TWICE weekly    3.Prepubertal Androgen Deficiency :  - This is secondary to Prolactinoma, as it appears this started around the age of 56 .  -Despite increasing testosterone to 4 pumps daily, hair growth has minimally increased, he is also having issues with insurance coverage so was switched to intramuscular injections 12/2021 - Tolerating testosterone cypionate well but his levels remain low, will increase dose as below    Medications  Change testosterone cypionate 200 mg/mL , to  150 mg (0.75 mL ) weekly  4. Snoring/Fatigue :   - He is at high risk for OSA, will refer for screening        F/U in 6 months   Signed electronically by: Mack Guise, MD  [facility]  [facility] [address] [place], [place] [address] Phone: [phone] FAX: [phone]   CC: Patient, No Pcp Per (Inactive) No address on file Phone: None Fax: None   Return to Endocrinology clinic as below: Future Appointments  Date Time Provider [place]  03/22/2022  9:10 AM Lyris Hitchman, Melanie Crazier, MD [id number] None

## 2022-03-25 MED ORDER — CABERGOLINE 0.5 MG PO TABS: 1.0000 mg | ORAL_TABLET | ORAL | 3 refills | Status: DC

## 2022-03-28 LAB — INSULIN-LIKE GROWTH FACTOR
IGF-I, LC/MS: 153 ng/mL (ref 83–456)
Z-Score (Male): -0.8 SD (ref ?–2.0)

## 2022-03-28 LAB — ACTH: C206 ACTH: 56 pg/mL — ABNORMAL HIGH (ref 6–50)

## 2022-03-28 LAB — PROLACTIN: Prolactin: 26.1 ng/mL — ABNORMAL HIGH (ref 2.0–18.0)

## 2022-04-26 ENCOUNTER — Institutional Professional Consult (permissible substitution): Payer: BC Managed Care – PPO | Admitting: Adult Health

## 2022-04-29 DIAGNOSIS — H5213 Myopia, bilateral: Secondary | ICD-10-CM | POA: Diagnosis not present

## 2022-11-05 ENCOUNTER — Telehealth: Payer: Self-pay | Admitting: Internal Medicine

## 2022-11-05 MED ORDER — TESTOSTERONE CYPIONATE 200 MG/ML IJ SOLN
150.0000 mg | INTRAMUSCULAR | 5 refills | Status: DC
Start: 2022-11-05 — End: 2023-01-29

## 2022-11-05 NOTE — Telephone Encounter (Signed)
MEDICATION: Testosterone   PHARMACY:  [facility] on [address]   HAS THE PATIENT CONTACTED THEIR PHARMACY?  yes  IS THIS A 90 DAY SUPPLY : unknown  IS PATIENT OUT OF MEDICATION: Testosterone   IF NOT; HOW MUCH IS LEFT:   LAST APPOINTMENT DATE: 03/22/22  NEXT APPOINTMENT DATE:'@6'$ /28/2024  DO WE HAVE YOUR PERMISSION TO LEAVE A DETAILED MESSAGE?: yes - [phone]  OTHER COMMENTS:    **Let patient know to contact pharmacy at the end of the day to make sure medication is ready. **  ** Please notify patient to allow 48-72 hours to process**  **Encourage patient to contact the pharmacy for refills or they can request refills through [facility]**

## 2022-12-16 ENCOUNTER — Other Ambulatory Visit: Payer: Self-pay

## 2022-12-16 MED ORDER — "BD DISP NEEDLES 22G X 1-1/2"" MISC": 1.0000 | Freq: Every day | 1 refills | Status: DC

## 2023-01-28 ENCOUNTER — Encounter: Payer: Self-pay | Admitting: Internal Medicine

## 2023-01-28 ENCOUNTER — Ambulatory Visit (INDEPENDENT_AMBULATORY_CARE_PROVIDER_SITE_OTHER): Payer: BC Managed Care – PPO | Admitting: Internal Medicine

## 2023-01-28 VITALS — BP 104/68 | HR 100 | Ht 71.0 in | Wt 279.0 lb

## 2023-01-28 DIAGNOSIS — D352 Benign neoplasm of pituitary gland: Secondary | ICD-10-CM

## 2023-01-28 DIAGNOSIS — E291 Testicular hypofunction: Secondary | ICD-10-CM | POA: Diagnosis not present

## 2023-01-28 LAB — LUTEINIZING HORMONE: LH: 0.29 m[IU]/mL — ABNORMAL LOW (ref 1.50–9.30)

## 2023-01-28 LAB — T4, FREE: Free T4: 0.53 ng/dL — ABNORMAL LOW (ref 0.60–1.60)

## 2023-01-28 LAB — CORTISOL: Cortisol, Plasma: 5.6 ug/dL

## 2023-01-28 LAB — TSH: TSH: 1.55 u[IU]/mL (ref 0.35–5.50)

## 2023-01-28 NOTE — Progress Notes (Unsigned)
Name: Travis Griffin  MRN/ DOB: [id number], [birth year]    Age/ Sex: 22 y.o., male    PCP: Travis Griffin, No Pcp Per   Reason for Endocrinology Evaluation: Pituitary Macroadenoma      Date of Initial Endocrinology Evaluation: 05/07/2021    HPI: Travis Griffin is a 22 y.o. male with a past medical history of Pituitary Adenoma . The Travis Griffin presented for initial endocrinology clinic visit on 05/07/2021  for consultative assistance with his Pituitary Macroadenoma/Prolactinoma .     HISTORICAL SUMMARY:   He has been referred here by Dr. Erling Conte for further evaluation of prolactinoma. He as been  evaluated for pituitary macroadenoma at 5 cm in max diameter,  infiltrating the left cavernous sinus , extending into the posterior sphenoid sinuses especially the left extending into the left orbital apex , and also tracking posteriorly toward the left cerebellar tentorium. The mass completely engulfs the left ICA siphon which appears to remain patent , and also abuts most of the right ICA siphon.   Travis Griffin has been noted with suprasellar mass during ophthalmological examination for visual field defect  Prior to this he did endorse blurry vision and galactorrhea mainly from the right nipple for 6 yrs prior to his presentations  Denied headaches until started on Cabergoline which was started 04/2021    Repeat brain MRI in March 2023 showed decrease in pituitary macroadenoma size from 5 cm to ~ 3 cm.     HYPOGONADISM HISTORY :  He was noted with physical features of prepubertal testosterone deficiency with sparse hair and small testicular size for age . This has been confirmed with scrotal ultrasound showing right testicular volume of 5.6 mL and left testicular size of 3.7 mL. Total Testerone was low at 58 ng/dL  No FH of endocrine disease or diabetes  Older brother with low sperm count , had to take testosterone and able to father biological children   Testosterone replacement therapy  started 05/2021, we initially started him on topical testosterone placement but due to lack of clinical effects and insurance issues this has been switched to IM injections by 12/2021   SUBJECTIVE:    Today (01/28/2023): Travis Griffin is here for a follow up on pituitary macroadenoma, prolactinoma and hypogonadism     Unfortunately Travis Griffin continues with none adherence to medical advice  He has not submitted 24-hour urinary cortisol to rule out Cushing syndrome He has not been seen in our clinic in 10 months  Last testosterone was Last Thursday  Mild headaches are present No visual changes  Has noted worsening acne  Has noted increase hair growth on face and chest  Denies constipation or diarrhea      HOME ENDOCRINE MEDICATIONS Cabergoline 0.5 mg, 2  tablet twice a week - 1 tab once weekly  Testosterone cypionate 200 mg /ml, 150 mg, 0.75  every 14 days     HISTORY:  Past Medical History: No past medical history on file. Past Surgical History:  Past Surgical History:  Procedure Laterality Date   left testicular  surgery Left     Social History:  reports that he has never smoked. He has never used smokeless tobacco. He reports that he does not drink alcohol and does not use drugs. Family History: family history is not on file.   HOME MEDICATIONS: Allergies as of 01/28/2023       Reactions   Amoxicillin    Milk-related Compounds Diarrhea   Stomach cramps with milk and ice-cream  Penicillins Hives   Has Travis Griffin had a PCN reaction causing immediate rash, facial/tongue/throat swelling, SOB or lightheadedness with hypotension: Unknown Has Travis Griffin had a PCN reaction causing severe rash involving mucus membranes or skin necrosis: Unknown Has Travis Griffin had a PCN reaction that required hospitalization: Unknown Has Travis Griffin had a PCN reaction occurring within the last 10 years: Unknown If all of the above answers are "NO", then may proceed with Cephalosporin use.         Medication List        Accurate as of January 28, 2023 10:18 AM. If you have any questions, ask your nurse or doctor.          BD Disp Needles 22G X 1-1/2" Misc Generic drug: NEEDLE (DISP) 22 G 1 Device by Does not apply route daily in the afternoon.   cabergoline 0.5 MG tablet Commonly known as: DOSTINEX Take 2 tablets (1 mg total) by mouth 2 (two) times a week.   fluticasone 50 MCG/ACT nasal spray Commonly known as: FLONASE Place 2 sprays into both nostrils daily.   ibuprofen 400 MG tablet Commonly known as: ADVIL Take 1 tablet (400 mg total) by mouth every 6 (six) hours as needed.   SYRINGE 3CC/25GX1-1/2" 25G X 1-1/2" 3 ML Misc 1 Device by Does not apply route daily in the afternoon.   Testosterone Cypionate 200 MG/ML Soln Inject 150 mg as directed once a week.          REVIEW OF SYSTEMS: A comprehensive ROS was conducted with the Travis Griffin and is negative except as per HPI     OBJECTIVE:  VS: BP 104/68 (BP Location: Left Arm, Travis Griffin Position: Sitting, Cuff Size: Large)   Pulse 100   Ht 5\' 11"  (1.803 m)   Wt 279 lb (126.6 kg)   SpO2 99%   BMI 38.91 kg/m    Wt Readings from Last 3 Encounters:  01/28/23 279 lb (126.6 kg)  03/22/22 261 lb 3.2 oz (118.5 kg)  12/14/21 249 lb (112.9 kg)     EXAM: General: Travis Griffin appears eunuchoid   Neck: General: Supple without adenopathy. Thyroid: Thyroid size normal.  No goiter or nodules appreciated.   Lungs: Clear with good BS bilat with no rales, rhonchi, or wheezes  Heart: Auscultation: RRR.  Abdomen: Normoactive bowel sounds, soft, nontender, without masses or organomegaly palpable  Extremities:  BL LE: No pretibial edema normal ROM and strength.  Skin: Hair: scatted closed comedones on the right cheek , fine with thick scattered hair on face and torso  Fine right abdominal quadrate striae  Skin Inspection: No rashes   Mental Status: Judgment, insight: Intact Orientation: Oriented to time, place, and person Mood  and affect: No depression, anxiety, or agitation     DATA REVIEWED:    Latest Reference Range & Units 01/28/23 10:43  Cortisol, Plasma ug/dL 5.6  LH 4.09 - 8.11 mIU/mL 0.29 (L)  Prolactin 2.0 - 18.0 ng/mL 37.4 (H)  TSH 0.35 - 5.50 uIU/mL 1.55  T4,Free(Direct) 0.60 - 1.60 ng/dL 9.14 (L)      7/82/9562 @ 1045 AM FSH 4.3 LH 1.9 TSH 1.66 ACTH 38.3 Cortisol 14.4 Prolactin 513 ng/ML - Confirmed with Dilution  IGF-1  99 ng/mL ( 130-865)  MRI Brain 04/06/2021  FINDINGS: Brain: Decreased size/bulk of a homogeneously enhancing lobulated infiltrating sellar and suprasellar mass, now measuring up to approximately 3.3 x 3.1 x 3.3 cm. Decreased mass effect on the adjacent surface of the brain anteriorly and decreased extension into the adjacent sphenoid  sinuses. There is persistent left greater than right cavernous sinus invasion with continued complete in circumferential in case mint of the left cavernous ICA and abutment the right cavernous ICA. Posterior extension to the left maxilla cave. Anteriorly, the mass approaches and encroaches on the left superior orbital fissure and orbital apex without definite intraorbital extension.   No evidence of acute hemorrhage, acute infarct, hydrocephalus, midline shift, or extra-axial fluid collection.   Vascular: Major arterial flow voids are maintained at the skull base.   Skull and upper cervical spine: Normal marrow signal.   Sinuses/Orbits: Persistent, but decreased mass effect on the optic chiasm by the mass. Mass extensive left orbital apex, as detailed above. Decreased extension of tumor into the sphenoid sinuses. Otherwise, sinuses are largely clear.   Other: No mastoid effusions.   IMPRESSION: Decreased bulk/size of a large sellar/suprasellar mass, described above.      ASSESSMENT/PLAN/RECOMMENDATIONS:   Pituitary Macroadenoma :   -Radiographically this is consistent with aggressive prolactinoma, but the reassuring  factor so far is that he has responded well to cabergoline - Saw Dr. Maurice Small, ( Neurosurgery ) in June,2022  will continue with medical management at this time ,  -Repeat brain MRI 12/2021 showed decrease in adenoma size from 5 cm to 3 cm -We will proceed with repeat MRI for this year   2. Prolactinoma   - With a baseline prolactin level of > 500 at Neurosurgery  office  (confirmed with dilution) . On his initial visit to our clinic he was already on Cabergoline for ~ 10 days and repeat prolactin was down to 29  -Unfortunately, he has been using less cabergoline than previously prescribed -We emphasized the importance of following instructions with cabergoline treatment to prevent increased size of prolactinoma -Prolactin continues to be elevated  Medications : Increase cabergoline 0.5 mg, 1 tablet TWICE weekly    3.Prepubertal Androgen Deficiency :  - This is secondary to Prolactinoma, as it appears this started around the age of 65 .  -With switch from topical testosterone to injectable by 12/2021 due to lack of clinical response -He has been noted with increase facial and body hair -Pending testosterone level today    Medications  Continue  testosterone cypionate 200 mg/mL ,  150 mg (0.75 mL ) weekly   4.  Low free T4  -TSH is normal -No intervention at this time, will continue to monitor   5.  Weight gain:  -We discussed proceeding with Cushing syndrome screening in 2023, Travis Griffin did not return 24-hour urinary cortisol -We again discussed the importance of ruling out Cushing syndrome -A new order has been placed    F/U in 6 months   Signed electronically by: Lyndle Herrlich, MD  [facility] End[facility]  [facility] [address] [place] [place], [place] [address] Phone: [phone] FAX: [phone]   CC: Travis Griffin, No Pcp Per No address on file Phone: None Fax: None   Return to Endocrinology clinic as below: No future  appointments.

## 2023-01-28 NOTE — Patient Instructions (Signed)

## 2023-01-29 MED ORDER — "SYRINGE 25G X 1-1/2"" 3 ML MISC": 1.0000 | Freq: Every day | 1 refills | Status: DC

## 2023-01-29 MED ORDER — TESTOSTERONE CYPIONATE 200 MG/ML IJ SOLN: 150.0000 mg | INTRAMUSCULAR | 5 refills | Status: DC

## 2023-01-29 MED ORDER — CABERGOLINE 0.5 MG PO TABS: 0.5000 mg | ORAL_TABLET | ORAL | 2 refills | Status: DC

## 2023-01-31 LAB — TESTOSTERONE, TOTAL, LC/MS/MS: Testosterone, Total, LC-MS-MS: 618 ng/dL (ref 250–1100)

## 2023-02-01 LAB — PROLACTIN: Prolactin: 37.4 ng/mL — ABNORMAL HIGH (ref 2.0–18.0)

## 2023-02-01 LAB — ACTH: C206 ACTH: 23 pg/mL (ref 6–50)

## 2023-02-25 ENCOUNTER — Ambulatory Visit
Admission: RE | Admit: 2023-02-25 | Discharge: 2023-02-25 | Disposition: A | Discharge status: Home / Self Care | Payer: BC Managed Care – PPO | Source: Ambulatory Visit | Attending: Nurse Practitioner | Admitting: Nurse Practitioner

## 2023-02-25 VITALS — BP 171/2 | HR 71 | Temp 98.8°F | Resp 18

## 2023-02-25 DIAGNOSIS — J069 Acute upper respiratory infection, unspecified: Secondary | ICD-10-CM | POA: Diagnosis not present

## 2023-02-25 DIAGNOSIS — H60501 Unspecified acute noninfective otitis externa, right ear: Secondary | ICD-10-CM

## 2023-02-25 MED ORDER — OFLOXACIN 0.3 % OT SOLN: 3.0000 [drp] | Freq: Two times a day (BID) | OTIC | 0 refills | Status: AC

## 2023-02-25 NOTE — ED Triage Notes (Signed)
Pt c/o sinus congestion and ear discomfort onset when it started to get warm outside pt states he takes allegra and tylenol cold and sinus and it helps but does not full alleviate sx's.

## 2023-02-25 NOTE — Discharge Instructions (Addendum)
You have upper respiratory infection with some right otitis exterior. We encourage conservative treatment with symptom relief.  You have been prescribed Ofloxacin 0.3% 3 drops right ear 2 times daily.  Recommend continue to use antihistamine if Allegra is not effective recommend cetirizine 10 mg daily. We encourage you to use Tylenol alternating with Ibuprofen for pain or fever if not contraindicated. (Remember to use as directed do not exceed daily dosing recommendations) We also encourage salt water gargles for any symptoms of sore throat. You should also consider throat lozenges and chloraseptic spray.  Your cough can be soothed with a cough suppressant.

## 2023-02-25 NOTE — ED Provider Notes (Signed)
` [facility]    CSN: [id number] Arrival date & time: 02/25/23  1458      History   Chief Complaint Chief Complaint  Patient presents with   Nasal Congestion    Pollen allergies turned into a possible upper respiratory infection also feels like my ear is clogged up - Entered by patient    HPI Travis Griffin is a 22 y.o. male.   HPI  He is in today complaining of nasal congestion with ongoing ear pain  He does have a history of seasonal allergies..  He has been taking Allegra and Tylenol with little to no relief.  He denies any fever, chills, headache, dizziness, shortness of breath or chest pain.  He denies any known exposures to any other potential infections. History reviewed. No pertinent past medical history.  Patient Active Problem List   Diagnosis Date Noted   Secondary testicular hypogonadism 06/01/2021   Pituitary macroadenoma with extrasellar extension (HCC) 05/08/2021   Bilateral undescended testicles 05/08/2021   Pituitary macroadenoma (HCC) 05/08/2021   Prolactinoma (HCC) 05/08/2021    Past Surgical History:  Procedure Laterality Date   left testicular  surgery Left        Home Medications    Prior to Admission medications   Medication Sig Start Date End Date Taking? Authorizing Provider  ofloxacin (FLOXIN) 0.3 % OTIC solution Place 3 drops into the right ear 2 (two) times daily for 7 days. 02/25/23 03/04/23 Yes Barbette Merino, NP  cabergoline (DOSTINEX) 0.5 MG tablet Take 1 tablet (0.5 mg total) by mouth 2 (two) times a week. 01/30/23   Shamleffer, Konrad Dolores, MD  fluticasone (FLONASE) 50 MCG/ACT nasal spray Place 2 sprays into both nostrils daily. 11/17/20   [provider]  ibuprofen (ADVIL,MOTRIN) 400 MG tablet Take 1 tablet (400 mg total) by mouth every 6 (six) hours as needed. 05/10/18   Eber Hong, MD  NEEDLE, DISP, 22 G (BD DISP NEEDLES) 22G X 1-1/2" MISC 1 Device by Does not apply route daily in the afternoon. 12/16/22    Shamleffer, Konrad Dolores, MD  Syringe/Needle, Disp, (SYRINGE 3CC/25GX1-1/2") 25G X 1-1/2" 3 ML MISC 1 Device by Does not apply route daily in the afternoon. 01/29/23   Shamleffer, Konrad Dolores, MD  Testosterone Cypionate 200 MG/ML SOLN Inject 150 mg as directed once a week. 01/29/23   Shamleffer, Konrad Dolores, MD    Family History History reviewed. No pertinent family history.  Social History Social History   Tobacco Use   Smoking status: Never   Smokeless tobacco: Never  Vaping Use   Vaping Use: Never used  Substance Use Topics   Alcohol use: Never   Drug use: Never     Allergies   Amoxicillin, Milk-related compounds, and Penicillins   Review of Systems Review of Systems   Physical Exam Triage Vital Signs ED Triage Vitals  Enc Vitals Group     BP 02/25/23 1530 (!) 171/2     Pulse Rate 02/25/23 1530 71     Resp 02/25/23 1530 18     Temp 02/25/23 1530 98.8 F (37.1 C)     Temp Source 02/25/23 1530 Oral     SpO2 02/25/23 1530 97 %     Weight --      Height --      Head Circumference --      Peak Flow --      Pain Score 02/25/23 1531 0     Pain Loc --  Pain Edu? --      Excl. in GC? --    No data found.  Updated Vital Signs BP (!) 171/2 (BP Location: Right Arm)   Pulse 71   Temp 98.8 F (37.1 C) (Oral)   Resp 18   SpO2 97%   Visual Acuity Right Eye Distance:   Left Eye Distance:   Bilateral Distance:    Right Eye Near:   Left Eye Near:    Bilateral Near:     Physical Exam Constitutional:      Appearance: He is obese.  HENT:     Head: Normocephalic and atraumatic.     Right Ear: There is no impacted cerumen.     Left Ear: There is no impacted cerumen.     Nose: Nose normal.  Cardiovascular:     Rate and Rhythm: Normal rate.     Pulses: Normal pulses.  Pulmonary:     Effort: Pulmonary effort is normal.  Musculoskeletal:        General: Normal range of motion.     Cervical back: Normal range of motion.  Skin:    General: Skin  is warm.     Capillary Refill: Capillary refill takes less than 2 seconds.  Neurological:     General: No focal deficit present.     Mental Status: He is alert and oriented to person, place, and time.  Psychiatric:        Mood and Affect: Mood normal.      UC Treatments / Results  Labs (all labs ordered are listed, but only abnormal results are displayed) Labs Reviewed - No data to display  EKG   Radiology No results found.  Procedures Procedures (including critical care time)  Medications Ordered in UC Medications - No data to display  Initial Impression / Assessment and Plan / UC Course  I have reviewed the triage vital signs and the nursing notes.  Pertinent labs & imaging results that were available during my care of the patient were reviewed by me and considered in my medical decision making (see chart for details).     Congestion Final Clinical Impressions(s) / UC Diagnoses   Final diagnoses:  Viral upper respiratory tract infection  Acute otitis externa of right ear, unspecified type     Discharge Instructions      You have upper respiratory infection with some right otitis exterior. We encourage conservative treatment with symptom relief.  You have been prescribed Ofloxacin 0.3% 3 drops right ear 2 times daily.  Recommend continue to use antihistamine if Allegra is not effective recommend cetirizine 10 mg daily. We encourage you to use Tylenol alternating with Ibuprofen for pain or fever if not contraindicated. (Remember to use as directed do not exceed daily dosing recommendations) We also encourage salt water gargles for any symptoms of sore throat. You should also consider throat lozenges and chloraseptic spray.  Your cough can be soothed with a cough suppressant.       ED Prescriptions     Medication Sig Dispense Auth. Provider   ofloxacin (FLOXIN) 0.3 % OTIC solution Place 3 drops into the right ear 2 (two) times daily for 7 days. 5 mL Barbette Merino, NP      PDMP not reviewed this encounter.   Thad Ranger [place], [place] 02/25/23 [phone]

## 2023-03-13 ENCOUNTER — Ambulatory Visit
Admission: RE | Admit: 2023-03-13 | Discharge: 2023-03-13 | Disposition: A | Discharge status: Home / Self Care | Payer: BC Managed Care – PPO | Source: Ambulatory Visit | Attending: Internal Medicine | Admitting: Internal Medicine

## 2023-03-13 DIAGNOSIS — D352 Benign neoplasm of pituitary gland: Secondary | ICD-10-CM

## 2023-03-13 DIAGNOSIS — G939 Disorder of brain, unspecified: Secondary | ICD-10-CM | POA: Diagnosis not present

## 2023-03-13 MED ORDER — GADOPICLENOL 0.5 MMOL/ML IV SOLN
10.0000 mL | Freq: Once | INTRAVENOUS | Status: AC | PRN
  Administered 2023-03-13: 10 mL via INTRAVENOUS

## 2023-04-11 ENCOUNTER — Ambulatory Visit: Payer: BC Managed Care – PPO | Admitting: Internal Medicine

## 2023-07-30 ENCOUNTER — Ambulatory Visit: Payer: BC Managed Care – PPO | Admitting: Internal Medicine

## 2023-07-30 ENCOUNTER — Encounter: Payer: Self-pay | Admitting: Internal Medicine

## 2023-07-30 VITALS — BP 118/80 | HR 65 | Ht 71.0 in | Wt 288.6 lb

## 2023-07-30 DIAGNOSIS — E291 Testicular hypofunction: Secondary | ICD-10-CM | POA: Diagnosis not present

## 2023-07-30 DIAGNOSIS — L700 Acne vulgaris: Secondary | ICD-10-CM

## 2023-07-30 DIAGNOSIS — D352 Benign neoplasm of pituitary gland: Secondary | ICD-10-CM | POA: Diagnosis not present

## 2023-07-30 NOTE — Progress Notes (Unsigned)
Name: Travis Griffin  MRN/ DOB: [id number], [birth year]    Age/ Sex: 22 y.o., male    PCP: Patient, No Pcp Per   Reason for Endocrinology Evaluation: Pituitary Macroadenoma      Date of Initial Endocrinology Evaluation: 05/07/2021    HPI: Mr. Travis Griffin is a 22 y.o. male with a past medical history of Pituitary Adenoma . The patient presented for initial endocrinology clinic visit on 05/07/2021  for consultative assistance with his Pituitary Macroadenoma/Prolactinoma .     HISTORICAL SUMMARY:   Travis Griffin has been referred here by Dr. Erling Conte for further evaluation of prolactinoma. Travis Griffin as been  evaluated for pituitary macroadenoma at 5 cm in max diameter,  infiltrating the left cavernous sinus , extending into the posterior sphenoid sinuses especially the left extending into the left orbital apex , and also tracking posteriorly toward the left cerebellar tentorium. The mass completely engulfs the left ICA siphon which appears to remain patent , and also abuts most of the right ICA siphon.   Pt has been noted with suprasellar mass during ophthalmological examination for visual field defect  Prior to this Travis Griffin did endorse blurry vision and galactorrhea mainly from the right nipple for 6 yrs prior to his presentations  Denied headaches until started on Cabergoline which was started 04/2021    Repeat brain MRI in March 2023 showed decrease in pituitary macroadenoma size from 5 cm to ~ 3 cm.     HYPOGONADISM HISTORY :  Travis Griffin was noted with physical features of prepubertal testosterone deficiency with sparse hair and small testicular size for age . This has been confirmed with scrotal ultrasound showing right testicular volume of 5.6 mL and left testicular size of 3.7 mL. Total Testerone was low at 58 ng/dL  No FH of endocrine disease or diabetes  Older brother with low sperm count , had to take testosterone and able to father biological children   Testosterone replacement therapy  started 05/2021, we initially started him on topical testosterone placement but due to lack of clinical effects and insurance issues this has been switched to IM injections by 12/2021   SUBJECTIVE:    Today (01/28/2023): Travis Griffin is here for a follow up on pituitary macroadenoma, prolactinoma and hypogonadism    Travis Griffin is accompanied by his mother today   Unfortunately patient continues with none adherence to cabergoline  Has recent contact  with headaches since than  Last testosterone was 2 weeks ago  Travis Griffin has acne that Travis Griffin attributes to acne  Has noted increase hair growth on face and chest  Denies constipation or diarrhea  Denies nausea or vomiting      HOME ENDOCRINE MEDICATIONS Cabergoline 0.5 mg, 1  tablet twice a week -  Testosterone cypionate 200 mg /ml, 150 mg, 0.75  every 14 days     HISTORY:  Past Medical History: No past medical history on file. Past Surgical History:  Past Surgical History:  Procedure Laterality Date   left testicular  surgery Left     Social History:  reports that Travis Griffin has never smoked. Travis Griffin has never used smokeless tobacco. Travis Griffin reports that Travis Griffin does not drink alcohol and does not use drugs. Family History: family history is not on file.   HOME MEDICATIONS: Allergies as of 07/30/2023       Reactions   Amoxicillin    Milk-related Compounds Diarrhea   Stomach cramps with milk and ice-cream   Penicillins Hives   Has patient had a  PCN reaction causing immediate rash, facial/tongue/throat swelling, SOB or lightheadedness with hypotension: Unknown Has patient had a PCN reaction causing severe rash involving mucus membranes or skin necrosis: Unknown Has patient had a PCN reaction that required hospitalization: Unknown Has patient had a PCN reaction occurring within the last 10 years: Unknown If all of the above answers are "NO", then may proceed with Cephalosporin use.        Medication List        Accurate as of July 30, 2023  8:34 AM.  If you have any questions, ask your nurse or doctor.          BD Disp Needles 22G X 1-1/2" Misc Generic drug: NEEDLE (DISP) 22 G 1 Device by Does not apply route daily in the afternoon.   cabergoline 0.5 MG tablet Commonly known as: DOSTINEX Take 1 tablet (0.5 mg total) by mouth 2 (two) times a week.   fluticasone 50 MCG/ACT nasal spray Commonly known as: FLONASE Place 2 sprays into both nostrils daily.   ibuprofen 400 MG tablet Commonly known as: ADVIL Take 1 tablet (400 mg total) by mouth every 6 (six) hours as needed.   SYRINGE 3CC/25GX1-1/2" 25G X 1-1/2" 3 ML Misc 1 Device by Does not apply route daily in the afternoon.   Testosterone Cypionate 200 MG/ML Soln Inject 150 mg as directed once a week.          REVIEW OF SYSTEMS: A comprehensive ROS was conducted with the patient and is negative except as per HPI     OBJECTIVE:  VS: BP 118/80   Pulse 65   Ht 5\' 11"  (1.803 m)   Wt 288 lb 9.6 oz (130.9 kg)   SpO2 99%   BMI 40.25 kg/m    Wt Readings from Last 3 Encounters:  01/28/23 279 lb (126.6 kg)  03/22/22 261 lb 3.2 oz (118.5 kg)  12/14/21 249 lb (112.9 kg)     EXAM: General: Normal facial hair noted Close comedones scattered over the face  Neck: General: Supple without adenopathy. Thyroid: Thyroid size normal.  No goiter or nodules appreciated.   Lungs: Clear with good BS bilat   Heart: Auscultation: RRR.  Abdomen:  soft, nontender  Extremities:  BL [id number]:[id number] pretibial edema   Mental Status: Judgment, insight: Intact Orientation: Oriented to time, place, and person Mood and affect: No depression, anxiety, or agitation     DATA REVIEWED:  Latest Reference Range & Units 07/30/23 15:59  Sodium 135 - 145 mEq/L 140  Potassium 3.5 - 5.1 mEq/L 4.0  Chloride 96 - 112 mEq/L 102  CO2 19 - 32 mEq/L 29  Glucose 70 - 99 mg/dL 86  BUN 6 - 23 mg/dL 11  Creatinine 1.30 - 8.65 mg/dL 7.84  Calcium 8.4 - 69.6 mg/dL 9.9  GFR >29.52 mL/min 116.74     Latest Reference Range & Units 07/30/23 15:59  Prolactin 2.0 - 18.0 ng/mL 116.2 (H)  Glucose 70 - 99 mg/dL 86  TSH 8.41 - 3.24 uIU/mL 1.78  T4,Free(Direct) 0.60 - 1.60 ng/dL 4.01        0/27/2536 @ 1045 AM FSH 4.3 LH 1.9 TSH 1.66 ACTH 38.3 Cortisol 14.4 Prolactin 513 ng/ML - Confirmed with Dilution  IGF-1  99 ng/mL ( 109-353)  MRI Brain 03/13/2023  FINDINGS: Brain: Continued decrease in bulk of the previously described enhancing and lobulated infiltrating sellar and suprasellar mass, which continues to involve the left greater than right cavernous sinuses and encase the left ICA. Similar extension  superiorly on the left with mass coming in close proximity to the left orbital apex. Interval development of a cystic/necrotic area on the left which likely represents an element of treatment change.   No evidence of acute infarct, acute hemorrhage, midline shift, or hydrocephalus. No pathologic enhancement elsewhere in the brain.   Vascular: Major arterial flow voids are maintained at the skull base.   Skull and upper cervical spine: Normal marrow signal.   Sinuses/Orbits: Negative.   IMPRESSION: Continued decrease in bulk of the left sellar/suprasellar mass with development of a cystic/necrotic area on the left likely representing treatment change.      ASSESSMENT/PLAN/RECOMMENDATIONS:   Pituitary Macroadenoma :   -Radiographically this is consistent with aggressive prolactinoma, but the reassuring factor so far is that Travis Griffin has responded well to cabergoline - Saw Dr. Maurice Small, ( Neurosurgery ) in June,2022  will continue with medical management at this time ,  -Repeat brain MRI 12/2021 showed decrease in adenoma size from 5 cm to 3 cm -Repeat brain MRI 02/2023, continues to show decrease in size -TFTs are normal as well as BMP on today's labs    2. Prolactinoma   - With a baseline prolactin level of > 500 at Neurosurgery  office  (confirmed with dilution) . On  his initial visit to our clinic Travis Griffin was already on Cabergoline for ~ 10 days and repeat prolactin was down to 29  -Unfortunately Travis Griffin continues with imperfect adherence to cabergoline, I again encouraged compliance with cabergoline, we discussed the risk of increased prolactinoma size which could affect his vision and lead to blindness -Prolactin level is high   Medications : Increase cabergoline 0.5 mg, 1 tablet TWICE weekly    3.Prepubertal Androgen Deficiency :  - This is secondary to Prolactinoma, as it appears this started around the age of 10 .  -With switching from topical testosterone to injectable by 12/2021 due to lack of clinical response -Travis Griffin has been noted with increase facial and body hair -Will not check testosterone as Travis Griffin has not been taking it over the past 2 weeks     Medications  Continue  testosterone cypionate 200 mg/mL ,  150 mg (0.75 mL ) weekly    4.  Acne Vulgaris:  -Most likely this has been exacerbated with testosterone use -Referral to dermatology has been placed    F/U in 6 months   Signed electronically by: Lyndle Herrlich, MD  [facility]  [facility] [address] [place] [place], [place] [address] Phone: [phone] FAX: [phone]   CC: Patient, No Pcp Per No address on file Phone: None Fax: None   Return to Endocrinology clinic as below: Future Appointments  Date Time Provider Department Center  07/30/2023  3:00 PM Braison Snoke, Konrad Dolores, MD [id number] None

## 2023-07-31 LAB — BASIC METABOLIC PANEL
BUN: 11 mg/dL (ref 6–23)
CO2: 29 meq/L (ref 19–32)
Calcium: 9.9 mg/dL (ref 8.4–10.5)
Chloride: 102 meq/L (ref 96–112)
Creatinine, Ser: 0.92 mg/dL (ref 0.40–1.50)
GFR: 116.74 mL/min (ref >60.00)
Glucose, Bld: 86 mg/dL (ref 70–99)
Potassium: 4 meq/L (ref 3.5–5.1)
Sodium: 140 meq/L (ref 135–145)

## 2023-07-31 LAB — PROLACTIN: Prolactin: 116.2 ng/mL — ABNORMAL HIGH (ref 2.0–18.0)

## 2023-07-31 LAB — T4, FREE: Free T4: 0.68 ng/dL (ref 0.60–1.60)

## 2023-07-31 LAB — TSH: TSH: 1.78 u[IU]/mL (ref 0.35–5.50)

## 2023-07-31 MED ORDER — CABERGOLINE 0.5 MG PO TABS: 0.5000 mg | ORAL_TABLET | ORAL | 2 refills | Status: DC

## 2023-07-31 MED ORDER — "BD DISP NEEDLES 22G X 1-1/2"" MISC": 1.0000 | Freq: Every day | 1 refills | Status: DC

## 2023-07-31 MED ORDER — TESTOSTERONE CYPIONATE 200 MG/ML IJ SOLN: 150.0000 mg | INTRAMUSCULAR | 5 refills | Status: DC

## 2023-07-31 MED ORDER — "SYRINGE 25G X 1-1/2"" 3 ML MISC": 1.0000 | Freq: Every day | 1 refills | Status: DC

## 2024-02-04 ENCOUNTER — Ambulatory Visit (INDEPENDENT_AMBULATORY_CARE_PROVIDER_SITE_OTHER): Payer: BC Managed Care – PPO | Admitting: Internal Medicine

## 2024-02-04 VITALS — BP 120/70 | HR 69 | Ht 71.0 in | Wt 276.8 lb

## 2024-02-04 DIAGNOSIS — E291 Testicular hypofunction: Secondary | ICD-10-CM | POA: Diagnosis not present

## 2024-02-04 DIAGNOSIS — D352 Benign neoplasm of pituitary gland: Secondary | ICD-10-CM

## 2024-02-04 NOTE — Progress Notes (Unsigned)
 Name: Travis Griffin  MRN/ DOB: [id number], 04-01-2001    Age/ Sex: 23 y.o., male    PCP: Patient, No Pcp Per   Reason for Endocrinology Evaluation: Pituitary Macroadenoma      Date of Initial Endocrinology Evaluation: 05/07/2021    HPI: Travis Griffin is a 23 y.o. male with a past medical history of Pituitary Adenoma . The patient presented for initial endocrinology clinic visit on 05/07/2021  for consultative assistance with his Pituitary Macroadenoma/Prolactinoma .     HISTORICAL SUMMARY:   He has been referred here by Dr. Avelina Bode for further evaluation of prolactinoma. He as been  evaluated for pituitary macroadenoma at 5 cm in max diameter,  infiltrating the left cavernous sinus , extending into the posterior sphenoid sinuses especially the left extending into the left orbital apex , and also tracking posteriorly toward the left cerebellar tentorium. The mass completely engulfs the left ICA siphon which appears to remain patent , and also abuts most of the right ICA siphon.   Pt has been noted with suprasellar mass during ophthalmological examination for visual field defect  Prior to this he did endorse blurry vision and galactorrhea mainly from the right nipple for 6 yrs prior to his presentations  Denied headaches until started on Cabergoline  which was started 04/2021    Repeat brain MRI in March 2023 showed decrease in pituitary macroadenoma size from 5 cm to ~ 3 cm.     HYPOGONADISM HISTORY :  He was noted with physical features of prepubertal testosterone  deficiency with sparse hair and small testicular size for age . This has been confirmed with scrotal ultrasound showing right testicular volume of 5.6 mL and left testicular size of 3.7 mL. Total Testerone was low at 58 ng/dL  No FH of endocrine disease or diabetes  Older brother with low sperm count , had to take testosterone  and able to father biological children   Testosterone  replacement therapy  started 05/2021, we initially started him on topical testosterone  placement but due to lack of clinical effects and insurance issues this has been switched to IM injections by 12/2021   SUBJECTIVE:    Today (01/28/2023): Travis Griffin is here for a follow up on pituitary macroadenoma, prolactinoma and hypogonadism    Patient has been noted with weight loss, with life style changes   Last testosterone  dose was last week ( Tuesday ) Denies headaches or vision changes  Acne has improved  Stable hair growth on face and chest area Has spontaneous erections  Denies constipation or diarrhea  Denies nausea or vomiting    HOME ENDOCRINE MEDICATIONS Cabergoline  0.5 mg, 1  tablet twice a week  ( Monday and Thursdays  Testosterone  cypionate 200 mg /ml, 150 mg (0.75)  every 14 days     HISTORY:  Past Medical History: No past medical history on file. Past Surgical History:  Past Surgical History:  Procedure Laterality Date   left testicular  surgery Left     Social History:  reports that he has never smoked. He has never used smokeless tobacco. He reports that he does not drink alcohol and does not use drugs. Family History: family history is not on file.   HOME MEDICATIONS: Allergies as of 02/04/2024       Reactions   Amoxicillin    Milk-related Compounds Diarrhea   Stomach cramps with milk and ice-cream   Penicillins Hives   Has patient had a PCN reaction causing immediate rash, facial/tongue/throat swelling,  SOB or lightheadedness with hypotension: Unknown Has patient had a PCN reaction causing severe rash involving mucus membranes or skin necrosis: Unknown Has patient had a PCN reaction that required hospitalization: Unknown Has patient had a PCN reaction occurring within the last 10 years: Unknown If all of the above answers are "NO", then may proceed with Cephalosporin use.        Medication List        Accurate as of February 04, 2024  3:08 PM. If you have any  questions, ask your nurse or doctor.          BD Disp Needles 22G X 1-1/2" Misc Generic drug: NEEDLE (DISP) 22 G 1 Device by Does not apply route daily in the afternoon.   cabergoline  0.5 MG tablet Commonly known as: DOSTINEX  Take 1 tablet (0.5 mg total) by mouth 2 (two) times a week.   fluticasone 50 MCG/ACT nasal spray Commonly known as: FLONASE Place 2 sprays into both nostrils daily.   ibuprofen  400 MG tablet Commonly known as: ADVIL  Take 1 tablet (400 mg total) by mouth every 6 (six) hours as needed.   SYRINGE 3CC/25GX1-1/2" 25G X 1-1/2" 3 ML Misc 1 Device by Does not apply route daily in the afternoon.   Testosterone  Cypionate 200 MG/ML Soln Inject 150 mg as directed once a week. What changed: when to take this          REVIEW OF SYSTEMS: A comprehensive ROS was conducted with the patient and is negative except as per HPI     OBJECTIVE:  VS: BP 120/70   Pulse 69   Ht 5\' 11"  (1.803 m)   Wt 276 lb 12.8 oz (125.6 kg)   SpO2 98%   BMI 38.61 kg/m    Wt Readings from Last 3 Encounters:  02/04/24 276 lb 12.8 oz (125.6 kg)  07/30/23 288 lb 9.6 oz (130.9 kg)  01/28/23 279 lb (126.6 kg)     EXAM: General: Normal facial hair noted Close comedones scattered over the face  Neck: General: Supple without adenopathy. Thyroid : Thyroid  size normal.  No goiter or nodules appreciated.   Lungs: Clear with good BS bilat   Heart: Auscultation: RRR.  Abdomen:  soft, nontender  Extremities:  BL [id number] pretibial edema   Mental Status: Judgment, insight: Intact Orientation: Oriented to time, place, and person Mood and affect: No depression, anxiety, or agitation     DATA REVIEWED:  Latest Reference Range & Units 07/30/23 15:59  Sodium 135 - 145 mEq/L 140  Potassium 3.5 - 5.1 mEq/L 4.0  Chloride 96 - 112 mEq/L 102  CO2 19 - 32 mEq/L 29  Glucose 70 - 99 mg/dL 86  BUN 6 - 23 mg/dL 11  Creatinine 8.29 - 5.62 mg/dL 1.30  Calcium 8.4 - 86.5 mg/dL 9.9  GFR  >78.46 mL/min 116.74    Latest Reference Range & Units 07/30/23 15:59  Prolactin 2.0 - 18.0 ng/mL 116.2 (H)  Glucose 70 - 99 mg/dL 86  TSH 9.62 - 9.52 uIU/mL 1.78  T4,Free(Direct) 0.60 - 1.60 ng/dL 8.41        01/04/4009 @ 1045 AM FSH 4.3 LH 1.9 TSH 1.66 ACTH  38.3 Cortisol 14.4 Prolactin 513 ng/ML - Confirmed with Dilution  IGF-1  99 ng/mL ( 109-353)  MRI Brain 03/13/2023  FINDINGS: Brain: Continued decrease in bulk of the previously described enhancing and lobulated infiltrating sellar and suprasellar mass, which continues to involve the left greater than right cavernous sinuses and encase the left ICA. Similar  extension superiorly on the left with mass coming in close proximity to the left orbital apex. Interval development of a cystic/necrotic area on the left which likely represents an element of treatment change.   No evidence of acute infarct, acute hemorrhage, midline shift, or hydrocephalus. No pathologic enhancement elsewhere in the brain.   Vascular: Major arterial flow voids are maintained at the skull base.   Skull and upper cervical spine: Normal marrow signal.   Sinuses/Orbits: Negative.   IMPRESSION: Continued decrease in bulk of the left sellar/suprasellar mass with development of a cystic/necrotic area on the left likely representing treatment change.      ASSESSMENT/PLAN/RECOMMENDATIONS:   Pituitary Macroadenoma :   -Radiographically this is consistent with aggressive prolactinoma, but the reassuring factor so far is that he has responded well to cabergoline  - Saw Dr. Ali Antonio, ( Neurosurgery ) in June,2022  will continue with medical management at this time ,  -Repeat brain MRI 12/2021 showed decrease in adenoma size from 5 cm to 3 cm -Repeat brain MRI 02/2023, continues to show decrease in size -TFTs are normal as well as BMP on today's labs    2. Prolactinoma   - With a baseline prolactin level of > 500 at Neurosurgery  office   (confirmed with dilution) . On his initial visit to our clinic he was already on Cabergoline  for ~ 10 days and repeat prolactin was down to 29  -Unfortunately he continues with imperfect adherence to cabergoline , I again encouraged compliance with cabergoline , we discussed the risk of increased prolactinoma size which could affect his vision and lead to blindness -Prolactin level is high   Medications : Increase cabergoline  0.5 mg, 1 tablet TWICE weekly    3.Prepubertal Androgen Deficiency :  - This is secondary to Prolactinoma, as it appears this started around the age of 41 .  -With switching from topical testosterone  to injectable by 12/2021 due to lack of clinical response -He has been noted with increase facial and body hair -Will not check testosterone  as he has not been taking it over the past 2 weeks     Medications  Continue  testosterone  cypionate 200 mg/mL ,  150 mg (0.75 mL ) Q14 days     F/U in 6 months   Signed electronically by: Natale Bail, MD  [facility]  [facility] [address] [place] [place], [place] [address] Phone: [phone] FAX: [phone]   CC: Patient, No Pcp Per No address on file Phone: None Fax: None   Return to Endocrinology clinic as below: No future appointments.

## 2024-02-05 ENCOUNTER — Encounter: Payer: Self-pay | Admitting: Internal Medicine

## 2024-02-05 MED ORDER — SYRINGE 25G X 1-1/2" 3 ML MISC: 1.0000 | Freq: Every day | 1 refills | Status: DC

## 2024-02-05 MED ORDER — TESTOSTERONE CYPIONATE 200 MG/ML IJ SOLN: 150.0000 mg | INTRAMUSCULAR | 5 refills | Status: DC

## 2024-02-05 MED ORDER — BD DISP NEEDLES 22G X 1-1/2" MISC: 1.0000 | Freq: Every day | 1 refills | Status: DC

## 2024-02-05 MED ORDER — CABERGOLINE 0.5 MG PO TABS: 0.5000 mg | ORAL_TABLET | ORAL | 2 refills | Status: DC

## 2024-02-07 LAB — ACTH: C206 ACTH: 19 pg/mL (ref 6–50)

## 2024-02-07 LAB — BASIC METABOLIC PANEL WITHOUT GFR
BUN: 10 mg/dL (ref 7–25)
CO2: 29 mmol/L (ref 20–32)
Calcium: 9.9 mg/dL (ref 8.6–10.3)
Chloride: 105 mmol/L (ref 98–110)
Creat: 0.83 mg/dL (ref 0.60–1.24)
Glucose, Bld: 82 mg/dL (ref 65–99)
Potassium: 4.4 mmol/L (ref 3.5–5.3)
Sodium: 142 mmol/L (ref 135–146)

## 2024-02-07 LAB — LUTEINIZING HORMONE: LH: 0.2 m[IU]/mL — ABNORMAL LOW (ref 1.5–9.3)

## 2024-02-07 LAB — TSH: TSH: 1.27 m[IU]/L (ref 0.40–4.50)

## 2024-02-07 LAB — TESTOSTERONE, TOTAL, LC/MS/MS: Testosterone, Total, LC-MS-MS: 289 ng/dL (ref 250–1100)

## 2024-02-07 LAB — CORTISOL: Cortisol, Plasma: 4.4 ug/dL

## 2024-02-07 LAB — T4, FREE: Free T4: 0.9 ng/dL (ref 0.8–1.8)

## 2024-02-07 LAB — PROLACTIN: Prolactin: 17.5 ng/mL (ref 2.0–18.0)

## 2024-02-09 ENCOUNTER — Other Ambulatory Visit (HOSPITAL_COMMUNITY): Payer: Self-pay

## 2024-02-09 ENCOUNTER — Telehealth: Payer: Self-pay

## 2024-02-09 NOTE — Telephone Encounter (Signed)
 Pharmacy Patient Advocate Encounter   Received notification from CoverMyMeds that prior authorization for Testosterone  inj is required/requested.   Insurance verification completed.   The patient is insured through [employer] .   Per test claim: PA required; PA submitted to above mentioned insurance via CoverMyMeds Key/confirmation #/EOC [id number] Status is pending

## 2024-02-11 NOTE — Telephone Encounter (Signed)
 Pharmacy Patient Advocate Encounter  Received notification from [employer] that Prior Authorization for Testosterone  has been APPROVED through 02/08/25   PA #/Case ID/Reference #: [id number]

## 2024-07-07 ENCOUNTER — Ambulatory Visit
Admission: EM | Admit: 2024-07-07 | Discharge: 2024-07-07 | Disposition: A | Discharge status: Home / Self Care | Attending: Family Medicine | Admitting: Family Medicine

## 2024-07-07 ENCOUNTER — Ambulatory Visit (INDEPENDENT_AMBULATORY_CARE_PROVIDER_SITE_OTHER)

## 2024-07-07 ENCOUNTER — Encounter: Payer: Self-pay | Admitting: Emergency Medicine

## 2024-07-07 ENCOUNTER — Ambulatory Visit: Admitting: Orthopedic Surgery

## 2024-07-07 ENCOUNTER — Encounter: Payer: Self-pay | Admitting: Orthopedic Surgery

## 2024-07-07 ENCOUNTER — Ambulatory Visit (HOSPITAL_COMMUNITY): Payer: Self-pay

## 2024-07-07 VITALS — BP 129/79 | HR 74 | Ht 71.0 in | Wt 268.0 lb

## 2024-07-07 DIAGNOSIS — M79671 Pain in right foot: Secondary | ICD-10-CM

## 2024-07-07 DIAGNOSIS — S92351A Displaced fracture of fifth metatarsal bone, right foot, initial encounter for closed fracture: Secondary | ICD-10-CM

## 2024-07-07 DIAGNOSIS — S93401A Sprain of unspecified ligament of right ankle, initial encounter: Secondary | ICD-10-CM | POA: Diagnosis not present

## 2024-07-07 DIAGNOSIS — S92354A Nondisplaced fracture of fifth metatarsal bone, right foot, initial encounter for closed fracture: Secondary | ICD-10-CM | POA: Diagnosis not present

## 2024-07-07 NOTE — Patient Instructions (Signed)

## 2024-07-07 NOTE — ED Provider Notes (Signed)
 [facility]    CSN: [id number] Arrival date & time: 07/07/24  0849      History   Chief Complaint No chief complaint on file.   HPI Travis Griffin is a 23 y.o. male.   Patient presenting today with 1 day history of right lateral foot pain and lateral ankle pain and swelling after stepping onto his foot sideways coming off of a trailer.  Denies complete loss of range of motion, numbness, tingling, skin injury.  Trying over-the-counter pain relievers with minimal relief.    History reviewed. No pertinent past medical history.  Patient Active Problem List   Diagnosis Date Noted   Acne vulgaris 07/30/2023   Secondary testicular hypogonadism 06/01/2021   Pituitary macroadenoma with extrasellar extension (HCC) 05/08/2021   Bilateral undescended testicles 05/08/2021   Pituitary macroadenoma (HCC) 05/08/2021   Prolactinoma (HCC) 05/08/2021    Past Surgical History:  Procedure Laterality Date   left testicular  surgery Left        Home Medications    Prior to Admission medications   Medication Sig Start Date End Date Taking? Authorizing Provider  cabergoline  (DOSTINEX ) 0.5 MG tablet Take 1 tablet (0.5 mg total) by mouth 2 (two) times a week. 02/05/24   Shamleffer, Ibtehal Jaralla, MD  fluticasone (FLONASE) 50 MCG/ACT nasal spray Place 2 sprays into both nostrils daily. Patient not taking: Reported on 07/07/2024 11/17/20   [provider]  ibuprofen  (ADVIL ,MOTRIN ) 400 MG tablet Take 1 tablet (400 mg total) by mouth every 6 (six) hours as needed. 05/10/18   Cleotilde Rogue, MD  NEEDLE, DISP, 22 G (BD DISP NEEDLES) 22G X 1-1/2 MISC 1 Device by Does not apply route daily in the afternoon. 02/05/24   Shamleffer, Donell Cardinal, MD  Syringe/Needle, Disp, (SYRINGE 3CC/25GX1-1/2) 25G X 1-1/2 3 ML MISC 1 Device by Does not apply route daily in the afternoon. 02/05/24   Shamleffer, Donell Cardinal, MD  Testosterone  Cypionate 200 MG/ML SOLN Inject 150 mg as directed  every 14 (fourteen) days. 02/05/24   Shamleffer, Donell Cardinal, MD    Family History History reviewed. No pertinent family history.  Social History Social History   Tobacco Use   Smoking status: Never   Smokeless tobacco: Never  Vaping Use   Vaping status: Never Used  Substance Use Topics   Alcohol use: Never   Drug use: Never     Allergies   Amoxicillin, Milk-related compounds, and Penicillins   Review of Systems Review of Systems Per HPI  Physical Exam Triage Vital Signs ED Triage Vitals  Encounter Vitals Group     BP 07/07/24 0915 115/79     Girls Systolic BP Percentile --      Girls Diastolic BP Percentile --      Boys Systolic BP Percentile --      Boys Diastolic BP Percentile --      Pulse Rate 07/07/24 0915 82     Resp 07/07/24 0915 18     Temp 07/07/24 0915 98.4 F (36.9 C)     Temp Source 07/07/24 0915 Oral     SpO2 07/07/24 0915 96 %     Weight --      Height --      Head Circumference --      Peak Flow --      Pain Score 07/07/24 0916 8     Pain Loc --      Pain Education --      Exclude from Growth Chart --  No data found.  Updated Vital Signs BP 115/79 (BP Location: Left Arm)   Pulse 82   Temp 98.4 F (36.9 C) (Oral)   Resp 18   SpO2 96%   Visual Acuity Right Eye Distance:   Left Eye Distance:   Bilateral Distance:    Right Eye Near:   Left Eye Near:    Bilateral Near:     Physical Exam Vitals and nursing note reviewed.  Constitutional:      Appearance: Normal appearance.  HENT:     Head: Atraumatic.  Eyes:     Extraocular Movements: Extraocular movements intact.     Conjunctiva/sclera: Conjunctivae normal.  Cardiovascular:     Rate and Rhythm: Normal rate.  Pulmonary:     Effort: Pulmonary effort is normal.  Musculoskeletal:        General: Swelling, tenderness and signs of injury present. No deformity. Normal range of motion.     Cervical back: Normal range of motion and neck supple.     Comments: Localized and  diffuse edema to the right lateral ankle and lateral midfoot.  Significantly tender to palpation in the lateral midfoot  Skin:    General: Skin is warm and dry.     Findings: No bruising or erythema.  Neurological:     Mental Status: He is oriented to person, place, and time.     Motor: No weakness.     Gait: Gait normal.     Comments: Right lower extremity neurovascularly intact  Psychiatric:        Mood and Affect: Mood normal.        Thought Content: Thought content normal.        Judgment: Judgment normal.      UC Treatments / Results  Labs (all labs ordered are listed, but only abnormal results are displayed) Labs Reviewed - No data to display  EKG   Radiology DG Foot Complete Right Result Date: 07/07/2024 EXAM: 3 OR MORE VIEW(S) XRAY OF THE RIGHT FOOT 07/07/2024 09:36:20 AM COMPARISON: None available. CLINICAL HISTORY: right foot and ankle pain FINDINGS: BONES AND JOINTS: Nondisplaced transverse fracture through the proximal fifth metatarsal shaft. No focal osseous lesion. No joint dislocation. SOFT TISSUES: Diffuse soft tissue swelling. IMPRESSION: 1. Nondisplaced transverse fracture through the proximal fifth metatarsal shaft. 2. Diffuse soft tissue swelling. Electronically signed by: Lonni Necessary MD 07/07/2024 10:11 AM EDT RP Workstation: [id number]   DG Ankle Complete Right Result Date: 07/07/2024 EXAM: 3 or more VIEW(S) XRAY OF THE RIGHT ANKLE 07/07/2024 09:36:09 AM CLINICAL HISTORY: rolled right ankle yesterday. pain and swelling. COMPARISON: None available. FINDINGS: BONES AND JOINTS: Acute nondisplaced transverse fracture through proximal shaft of right fifth metatarsal. No focal osseous lesion. No joint dislocation. SOFT TISSUES: Diffuse soft tissue swelling surrounding right ankle. IMPRESSION: 1. Acute nondisplaced transverse fracture through proximal shaft of right fifth metatarsal. 2. Diffuse soft tissue swelling surrounding right ankle. Electronically signed  by: Lonni Necessary MD 07/07/2024 10:10 AM EDT RP Workstation: [id number]    Procedures Procedures (including critical care time)  Medications Ordered in UC Medications - No data to display  Initial Impression / Assessment and Plan / UC Course  I have reviewed the triage vital signs and the nursing notes.  Pertinent labs & imaging results that were available during my care of the patient were reviewed by me and considered in my medical decision making (see chart for details).     X-ray of the right foot and ankle showing a nondisplaced fracture to  the right fifth metatarsal.  Placed in a cam boot, given crutches to be nonweightbearing and discussed RICE protocol, over-the-counter pain relievers and Ortho follow-up.  Return for worsening symptoms.  Final Clinical Impressions(s) / UC Diagnoses   Final diagnoses:  Right foot pain  Closed fracture of base of fifth metatarsal bone of right foot, initial encounter   Discharge Instructions   None    ED Prescriptions   None    PDMP not reviewed this encounter.   Stuart Vernell Norris, PA-C 07/07/24 1159

## 2024-07-07 NOTE — ED Triage Notes (Signed)
 Stepped off a trailer and rolled right ankle yesterday.  Pain and swelling to right ankle.

## 2024-07-07 NOTE — Progress Notes (Signed)
 New Patient Visit  Assessment: Travis Griffin is a 23 y.o. male with the following: 1. Closed nondisplaced fracture of fifth metatarsal bone of right foot, initial encounter  Plan: Travis Griffin rolled his ankle last evening, and sustained a minimally displaced fracture at the base of the fifth metatarsal.  There is minimal to no displacement.  I think this is amenable to nonoperative management.  In order to maximize the chance of full recovery without surgery, I recommended a cast, and to remain nonweightbearing.  He will continue to use crutches.  He was to remain nonweightbearing.  Elevate the foot that was swelling and pain.  Follow-up in 2 weeks.  Cast application - Right short leg cast   Verbal consent was obtained and the correct extremity was identified. A well padded, appropriately molded short leg cast was applied to the Right leg Fingers remained warm and well perfused.   There were no sharp edges Patient tolerated the procedure well Cast care instructions were provided    Follow-up: Return in about 2 weeks (around 07/21/2024).  Subjective:  Chief Complaint  Patient presents with   Foot Injury    Right- DOI 07/06/24 Was loading a couch onto a trailer and mis-stepped and steeped off the trailer and landed on to the side of my foot     History of Present Illness: Travis Griffin is a 24 y.o. male who presents for evaluation of right foot pain.  He states he rolled his right ankle stepping off a trailer yesterday evening.  Injury happened around 6:00 pm.  He had immediate pain and swelling.  He presented to an urgent care this morning.  He had radiographs, which confirmed a fracture.  He was placed in a boot, told to remain nonweightbearing.  He has been using crutches.  He presented to clinic a few hours after evaluation at the urgent care center.  Nothing has changed since he was at the urgent care center.   Review of Systems: No fevers or chills No numbness or  tingling No chest pain No shortness of breath No bowel or bladder dysfunction No GI distress No headaches   Medical History:  History reviewed. No pertinent past medical history.  Past Surgical History:  Procedure Laterality Date   left testicular  surgery Left     History reviewed. No pertinent family history. Social History   Tobacco Use   Smoking status: Never   Smokeless tobacco: Never  Vaping Use   Vaping status: Never Used  Substance Use Topics   Alcohol use: Never   Drug use: Never    Allergies  Allergen Reactions   Amoxicillin    Milk-Related Compounds Diarrhea    Stomach cramps with milk and ice-cream   Penicillins Hives    Has patient had a PCN reaction causing immediate rash, facial/tongue/throat swelling, SOB or lightheadedness with hypotension: Unknown Has patient had a PCN reaction causing severe rash involving mucus membranes or skin necrosis: Unknown Has patient had a PCN reaction that required hospitalization: Unknown Has patient had a PCN reaction occurring within the last 10 years: Unknown If all of the above answers are NO, then may proceed with Cephalosporin use.     Current Meds  Medication Sig   cabergoline  (DOSTINEX ) 0.5 MG tablet Take 1 tablet (0.5 mg total) by mouth 2 (two) times a week.   ibuprofen  (ADVIL ,MOTRIN ) 400 MG tablet Take 1 tablet (400 mg total) by mouth every 6 (six) hours as needed.   NEEDLE, DISP,  22 G (BD DISP NEEDLES) 22G X 1-1/2 MISC 1 Device by Does not apply route daily in the afternoon.   Syringe/Needle, Disp, (SYRINGE 3CC/25GX1-1/2) 25G X 1-1/2 3 ML MISC 1 Device by Does not apply route daily in the afternoon.   Testosterone  Cypionate 200 MG/ML SOLN Inject 150 mg as directed every 14 (fourteen) days.    Objective: BP 129/79   Pulse 74   Ht 5' 11 (1.803 m)   Wt 268 lb (121.6 kg)   BMI 37.38 kg/m   Physical Exam:  General: Alert and oriented. and No acute distress. Gait: Ambulates with the assistance of  crutches  Right foot with diffuse swelling.  Tenderness to palpation over the lateral border of the foot.  There is some bruising already.  Toes warm well-perfused.  Sensation intact to the dorsum of the foot.    IMAGING: I personally reviewed images previously obtained from the ED  Minimally splays fracture at the base of the right fifth metatarsal   New Medications:  No orders of the defined types were placed in this encounter.     Travis DELENA Horde, MD  07/07/2024 11:50 AM

## 2024-07-21 ENCOUNTER — Ambulatory Visit: Admitting: Orthopedic Surgery

## 2024-07-21 ENCOUNTER — Encounter: Payer: Self-pay | Admitting: Orthopedic Surgery

## 2024-07-21 ENCOUNTER — Other Ambulatory Visit (INDEPENDENT_AMBULATORY_CARE_PROVIDER_SITE_OTHER): Payer: Self-pay

## 2024-07-21 DIAGNOSIS — S92354A Nondisplaced fracture of fifth metatarsal bone, right foot, initial encounter for closed fracture: Secondary | ICD-10-CM

## 2024-07-21 NOTE — Patient Instructions (Signed)

## 2024-07-21 NOTE — Progress Notes (Signed)
 Return patient Visit  Assessment: Travis Griffin is a 23 y.o. male with the following: 1. Closed nondisplaced fracture of fifth metatarsal bone of right foot, subsequent encounter  Plan: Travis Griffin sustained a right fifth metatarsal fracture, a couple of weeks ago.  Radiographs remain unchanged.  There has been no further displacement.  No obvious signs of callus formation.  The bruising, swelling and tenderness have all improved.  He is no longer taking pain medications.  Clinically, I think he is doing well.  We will continue to monitor this closely.  We discussed the possibility of a fibrous nonunion, and all questions have been answered.  We will place him in another cast, and plan to see him back in 2 weeks.  Cast application - Right short leg cast   Verbal consent was obtained and the correct extremity was identified. A well padded, appropriately molded short leg cast was applied to the Right leg Fingers remained warm and well perfused.   There were no sharp edges Patient tolerated the procedure well Cast care instructions were provided    Follow-up: Return in about 2 weeks (around 08/04/2024).  Subjective:  Chief Complaint  Patient presents with   Fracture    R foot DOI 07/06/24    History of Present Illness: Travis Griffin is a 23 y.o. male who returns for evaluation of right foot pain.  He rolled his right ankle, and impacted the lateral aspect of the right foot a couple of weeks ago.  He sustained a minimally displaced fracture of the right fifth metatarsal.  He has done well in a cast.  He states that he needed some pain medications, especially early in the morning, for the first few days following the injury.  However, he is no longer taking any medications.  He is using crutches.  He is resting his foot on the floor occasionally.   Review of Systems: No fevers or chills No numbness or tingling No chest pain No shortness of breath No bowel or bladder  dysfunction No GI distress No headaches      Objective: There were no vitals taken for this visit.  Physical Exam:  General: Alert and oriented. and No acute distress. Gait: Ambulates with the assistance of crutches  Evaluation of the right foot after removal of the cast demonstrates no skin breakdown.  No irritation.  There is some residual bruising medial and lateral.  He does have some mild tenderness across the lateral border of the foot, in line with the fifth metatarsal.  Mild discomfort with inversion and eversion of the ankle.  Toes warm and well-perfused.  Sensation intact to the dorsum of the foot.    IMAGING: I personally ordered and reviewed the following images  X-rays of the right foot were obtained in clinic today.  These are nonweightbearing views.  These are compared to available images.  There is a fracture in the proximal shaft of the fifth metatarsal.  Minimal displacement.  Fracture does not breach the medial cortex.  There is no obvious callus formation.  There has been no interval displacement.  No additional injuries.  Impression: Stable right fifth metatarsal fracture   New Medications:  No orders of the defined types were placed in this encounter.     Travis DELENA Horde, MD  07/21/2024 9:00 AM

## 2024-08-03 ENCOUNTER — Other Ambulatory Visit: Payer: Self-pay

## 2024-08-03 MED ORDER — CABERGOLINE 0.5 MG PO TABS: 0.5000 mg | ORAL_TABLET | ORAL | 0 refills | Status: DC

## 2024-08-04 ENCOUNTER — Other Ambulatory Visit (INDEPENDENT_AMBULATORY_CARE_PROVIDER_SITE_OTHER): Payer: Self-pay

## 2024-08-04 ENCOUNTER — Ambulatory Visit (INDEPENDENT_AMBULATORY_CARE_PROVIDER_SITE_OTHER): Admitting: Orthopedic Surgery

## 2024-08-04 ENCOUNTER — Encounter: Payer: Self-pay | Admitting: Orthopedic Surgery

## 2024-08-04 DIAGNOSIS — S92354A Nondisplaced fracture of fifth metatarsal bone, right foot, initial encounter for closed fracture: Secondary | ICD-10-CM | POA: Diagnosis not present

## 2024-08-04 NOTE — Patient Instructions (Signed)
 Note for visit today   Ok to put some weight on your foot, while wearing the boot.  Continue to use crutches to help with ambulation  Ok to remove the boot for hygiene.   You do not need to wear the boot to bed

## 2024-08-06 ENCOUNTER — Encounter: Payer: Self-pay | Admitting: Orthopedic Surgery

## 2024-08-06 NOTE — Progress Notes (Signed)
 Return patient Visit  Assessment: Travis Griffin is a 23 y.o. male with the following: 1. Closed nondisplaced fracture of fifth metatarsal bone of right foot, subsequent encounter  Plan: Travis Griffin sustained a right fifth metatarsal fracture, approximately 1 month ago.  Radiographs stable.  Transition to a walking boot.  Ok to bear weight in the boot with crutches.  Follow up in 2 weeks with repeat XR.   Follow-up: Return in about 2 weeks (around 08/18/2024).  Subjective:  Chief Complaint  Patient presents with   Fracture    R foot DOI 07/06/24    History of Present Illness: Travis Griffin is a 23 y.o. male who returns for evaluation of right foot pain.  He rolled his right ankle, and impacted the lateral aspect of the right foot a month ago.  He sustained a minimally displaced fracture of the right fifth metatarsal.  He has been in a cast for a month.  No pain.    Review of Systems: No fevers or chills No numbness or tingling No chest pain No shortness of breath No bowel or bladder dysfunction No GI distress No headaches      Objective: There were no vitals taken for this visit.  Physical Exam:  General: Alert and oriented. and No acute distress. Gait: Ambulates with the assistance of crutches  Evaluation of the right foot after removal of the cast demonstrates no skin breakdown.  No irritation.  No bruising.  No swelling.  Mild tenderness over the 5th MT.  Sensation intact to the dorsum of the foot.     IMAGING: I personally ordered and reviewed the following images  X-rays of the right foot were obtained in clinic today.  These are nonweightbearing views.  These are compared to prior images.  Fracture at the base of the 5th metatarsal is again visualized.  No interval displacement.  The fracture has slightly widened.  Small amount of callus is noted lateral to the fracture.  No bony lesions.   Impression: Right 5th metatarsal shaft fracture without  interval displacement.    New Medications:  No orders of the defined types were placed in this encounter.     Travis DELENA Horde, MD  08/06/2024 11:44 PM

## 2024-08-09 ENCOUNTER — Encounter: Payer: Self-pay | Admitting: Internal Medicine

## 2024-08-09 ENCOUNTER — Telehealth: Payer: Self-pay

## 2024-08-09 ENCOUNTER — Telehealth: Payer: Self-pay | Admitting: Internal Medicine

## 2024-08-09 ENCOUNTER — Ambulatory Visit: Admitting: Internal Medicine

## 2024-08-09 ENCOUNTER — Other Ambulatory Visit (HOSPITAL_COMMUNITY): Payer: Self-pay

## 2024-08-09 ENCOUNTER — Other Ambulatory Visit

## 2024-08-09 VITALS — BP 126/84 | HR 100 | Ht 71.0 in | Wt 273.0 lb

## 2024-08-09 DIAGNOSIS — E291 Testicular hypofunction: Secondary | ICD-10-CM | POA: Diagnosis not present

## 2024-08-09 DIAGNOSIS — D352 Benign neoplasm of pituitary gland: Secondary | ICD-10-CM | POA: Diagnosis not present

## 2024-08-09 MED ORDER — TESTOSTERONE CYPIONATE 200 MG/ML IJ SOLN: 150.0000 mg | INTRAMUSCULAR | Status: DC

## 2024-08-09 NOTE — Telephone Encounter (Signed)
 Patient with pituitary macroadenoma and secondary hypogonadism and would require long-term testosterone  replacement therapy.   He has not been consistent with injections and will benefit from oral testosterone  therapy   I tried to prescribe Jatenzo, which is oral testosterone , it shows it is not covered I when I tried to use the specialty pharmacy the cost was over thousand dollars    Can you please see if there is a different oral formulation that would be covered by his insurance or if I could use nasal spray but no injectable or topical of possible    Thanks

## 2024-08-09 NOTE — Progress Notes (Unsigned)
 Name: Travis Griffin  MRN/ DOB: [id number], 02/05/2001    Age/ Sex: 23 y.o., male    PCP: Patient, No Pcp Per   Reason for Endocrinology Evaluation: Pituitary Macroadenoma      Date of Initial Endocrinology Evaluation: 05/07/2021    HPI: Mr. Travis Griffin is a 23 y.o. male with a past medical history of Pituitary Adenoma . The patient presented for initial endocrinology clinic visit on 05/07/2021  for consultative assistance with his Pituitary Macroadenoma/Prolactinoma .     HISTORICAL SUMMARY:   He has been referred here by Dr. Ronnie for further evaluation of prolactinoma. He as been  evaluated for pituitary macroadenoma at 5 cm in max diameter,  infiltrating the left cavernous sinus , extending into the posterior sphenoid sinuses especially the left extending into the left orbital apex , and also tracking posteriorly toward the left cerebellar tentorium. The mass completely engulfs the left ICA siphon which appears to remain patent , and also abuts most of the right ICA siphon.   Pt has been noted with suprasellar mass during ophthalmological examination for visual field defect  Prior to this he did endorse blurry vision and galactorrhea mainly from the right nipple for 6 yrs prior to his presentations  Denied headaches until started on Cabergoline  which was started 04/2021    Repeat brain MRI in March 2023 showed decrease in pituitary macroadenoma size from 5 cm to ~ 3 cm.     HYPOGONADISM HISTORY :  He was noted with physical features of prepubertal testosterone  deficiency with sparse hair and small testicular size for age . This has been confirmed with scrotal ultrasound showing right testicular volume of 5.6 mL and left testicular size of 3.7 mL. Total Testerone was low at 58 ng/dL  No FH of endocrine disease or diabetes  Older brother with low sperm count , had to take testosterone  and able to father biological children   Testosterone  replacement therapy  started 05/2021, we initially started him on topical testosterone  placement but due to lack of clinical effects and insurance issues this has been switched to IM injections by 12/2021   SUBJECTIVE:    Today (01/28/2023): Travis Griffin is here for a follow up on pituitary macroadenoma, prolactinoma and hypogonadism    Patient is weight loss He with right foot cast due to 5th toe fracture , fell off the trailor   Last testosterone  dose ~ 2 months ago  No headaches  No visions changes  Facial hair is optimal Spontaneous erections have decreased while being of testosterone   NO nipple discharge  No constipation or diarrhea  No nausea  Acne is stable     HOME ENDOCRINE MEDICATIONS Cabergoline  0.5 mg, 1  tablet twice a week  ( Monday and Thursdays  Testosterone  cypionate 200 mg /ml, 150 mg (0.75)  every 14 days     HISTORY:  Past Medical History: No past medical history on file. Past Surgical History:  Past Surgical History:  Procedure Laterality Date   left testicular  surgery Left     Social History:  reports that he has never smoked. He has never used smokeless tobacco. He reports that he does not drink alcohol and does not use drugs. Family History: family history is not on file.   HOME MEDICATIONS: Allergies as of 08/09/2024       Reactions   Amoxicillin    Milk-related Compounds Diarrhea   Stomach cramps with milk and ice-cream   Penicillins Hives  Has patient had a PCN reaction causing immediate rash, facial/tongue/throat swelling, SOB or lightheadedness with hypotension: Unknown Has patient had a PCN reaction causing severe rash involving mucus membranes or skin necrosis: Unknown Has patient had a PCN reaction that required hospitalization: Unknown Has patient had a PCN reaction occurring within the last 10 years: Unknown If all of the above answers are NO, then may proceed with Cephalosporin use.        Medication List        Accurate as of August 09, 2024  3:08 PM. If you have any questions, ask your nurse or doctor.          BD Disp Needles 22G X 1-1/2 Misc Generic drug: NEEDLE (DISP) 22 G 1 Device by Does not apply route daily in the afternoon.   cabergoline  0.5 MG tablet Commonly known as: DOSTINEX  Take 1 tablet (0.5 mg total) by mouth 2 (two) times a week.   SYRINGE 3CC/25GX1-1/2 25G X 1-1/2 3 ML Misc 1 Device by Does not apply route daily in the afternoon.   Testosterone  Cypionate 200 MG/ML Soln Inject 150 mg as directed every 14 (fourteen) days.          REVIEW OF SYSTEMS: A comprehensive ROS was conducted with the patient and is negative except as per HPI     OBJECTIVE:  VS: BP 126/84 (BP Location: Left Arm, Patient Position: Sitting, Cuff Size: Normal)   Pulse 100   Ht 5' 11 (1.803 m)   Wt 273 lb (123.8 kg)   SpO2 98%   BMI 38.08 kg/m    Wt Readings from Last 3 Encounters:  08/09/24 273 lb (123.8 kg)  07/07/24 268 lb (121.6 kg)  02/04/24 276 lb 12.8 oz (125.6 kg)     EXAM: General: Normal facial hair noted  Neck: General: Supple without adenopathy. Thyroid : Thyroid  size normal.  No goiter or nodules appreciated.   Lungs: Clear with good BS bilat   Heart: Auscultation: RRR.  Abdomen:  soft, nontender  Extremities:  BL OZ:umjrz pretibial edema   Mental Status: Judgment, insight: Intact Orientation: Oriented to time, place, and person Mood and affect: No depression, anxiety, or agitation     DATA REVIEWED:   Latest Reference Range & Units 08/09/24 15:30  Prolactin 2.0 - 18.0 ng/mL 7.4  TSH 0.40 - 4.50 mIU/L 0.84  T4,Free(Direct) 0.8 - 1.8 ng/dL 1.1      Latest Reference Range & Units 02/04/24 15:37  Sodium 135 - 146 mmol/L 142  Potassium 3.5 - 5.3 mmol/L 4.4  Chloride 98 - 110 mmol/L 105  CO2 20 - 32 mmol/L 29  Glucose 65 - 99 mg/dL 82  BUN 7 - 25 mg/dL 10  Creatinine 9.39 - 8.75 mg/dL 9.16  Calcium 8.6 - 89.6 mg/dL 9.9  BUN/Creatinine Ratio 6 - 22 (calc) SEE NOTE:     Latest Reference Range & Units 02/04/24 15:37  LH 1.5 - 9.3 mIU/mL 0.2 (L)  Prolactin 2.0 - 18.0 ng/mL 17.5  Glucose 65 - 99 mg/dL 82  TSH 9.59 - 5.49 mIU/L 1.27  T4,Free(Direct) 0.8 - 1.8 ng/dL 0.9      3/69/7977 @ 8954 AM FSH 4.3 LH 1.9 TSH 1.66 ACTH  38.3 Cortisol 14.4 Prolactin 513 ng/ML - Confirmed with Dilution  IGF-1  99 ng/mL ( 109-353)  MRI Brain 03/13/2023  FINDINGS: Brain: Continued decrease in bulk of the previously described enhancing and lobulated infiltrating sellar and suprasellar mass, which continues to involve the left greater than right cavernous  sinuses and encase the left ICA. Similar extension superiorly on the left with mass coming in close proximity to the left orbital apex. Interval development of a cystic/necrotic area on the left which likely represents an element of treatment change.   No evidence of acute infarct, acute hemorrhage, midline shift, or hydrocephalus. No pathologic enhancement elsewhere in the brain.   Vascular: Major arterial flow voids are maintained at the skull base.   Skull and upper cervical spine: Normal marrow signal.   Sinuses/Orbits: Negative.   IMPRESSION: Continued decrease in bulk of the left sellar/suprasellar mass with development of a cystic/necrotic area on the left likely representing treatment change.      ASSESSMENT/PLAN/RECOMMENDATIONS:   Pituitary Macroadenoma :   -Radiographically this is consistent with aggressive prolactinoma, but the reassuring factor so far is that he has responded well to cabergoline  - Saw Dr. Cheryle, ( Neurosurgery ) in June,2022  will continue with medical management at this time ,  -Repeat brain MRI 12/2021 showed decrease in adenoma size from 5 cm to 3 cm -Repeat brain MRI 02/2023, continues to show decrease in size - TFTs normal  2. Prolactinoma   - With a baseline prolactin level of > 500 at Neurosurgery  office  (confirmed with dilution) . On his initial visit to  our clinic he was already on Cabergoline  for ~ 10 days and repeat prolactin was down to 29  - Prolactin remains normal, no change  Medications : Continue cabergoline  0.5 mg, 1 tablet TWICE weekly    3.Prepubertal Androgen Deficiency :  - This is secondary to Prolactinoma, as it appears this started around the age of 89 .  -We switched  from topical testosterone  to injectable by 12/2021 due to lack of clinical response, unfortunately he continues to imperfect adherence to testosterone  injections due to variable reasons, I suspect he will do better with oral versus injectables even though the patient denies having difficulty with testosterone  injections but intermittent compliance is a concern at his young age and a recent bone fracture -I will switch him from injectable testosterone  cypionate to oral testosterone       Medications  Stop testosterone  cypionate 200 mg/mL ,  150 mg (0.75 mL ) Q14 days  Start Jatenzo 237 mg twice daily   F/U in 6 months   Signed electronically by: Stefano Redgie Butts, MD  [facility] End[facility]  [facility] [address] [place] [place], [place] [address] Phone: [phone] FAX: [phone]   CC: Patient, No Pcp Per No address on file Phone: None Fax: None   Return to Endocrinology clinic as below: Future Appointments  Date Time Provider Department Center  08/18/2024  3:00 PM Onesimo Oneil LABOR, MD OCR-OCR None

## 2024-08-09 NOTE — Telephone Encounter (Unsigned)
 Pharmacy Patient Advocate Encounter   Received notification from Pt Calls Messages that prior authorization for Jatenzo is required/requested.   Insurance verification completed.   The patient is insured through [employer].   Per test claim: {copay/ins/PA:29915}

## 2024-08-10 ENCOUNTER — Other Ambulatory Visit (HOSPITAL_COMMUNITY): Payer: Self-pay

## 2024-08-10 ENCOUNTER — Ambulatory Visit: Payer: Self-pay | Admitting: Internal Medicine

## 2024-08-10 LAB — T4, FREE: Free T4: 1.1 ng/dL (ref 0.8–1.8)

## 2024-08-10 LAB — PROLACTIN: Prolactin: 7.4 ng/mL (ref 2.0–18.0)

## 2024-08-10 LAB — TSH: TSH: 0.84 m[IU]/L (ref 0.40–4.50)

## 2024-08-10 MED ORDER — CABERGOLINE 0.5 MG PO TABS: 0.5000 mg | ORAL_TABLET | ORAL | 3 refills | Status: AC

## 2024-08-10 MED ORDER — JATENZO 237 MG PO CAPS: 237.0000 mg | ORAL_CAPSULE | Freq: Two times a day (BID) | ORAL | 5 refills | Status: AC

## 2024-08-18 ENCOUNTER — Other Ambulatory Visit (INDEPENDENT_AMBULATORY_CARE_PROVIDER_SITE_OTHER): Payer: Self-pay

## 2024-08-18 ENCOUNTER — Ambulatory Visit (INDEPENDENT_AMBULATORY_CARE_PROVIDER_SITE_OTHER): Admitting: Orthopedic Surgery

## 2024-08-18 DIAGNOSIS — S92354A Nondisplaced fracture of fifth metatarsal bone, right foot, initial encounter for closed fracture: Secondary | ICD-10-CM

## 2024-08-18 DIAGNOSIS — S92354D Nondisplaced fracture of fifth metatarsal bone, right foot, subsequent encounter for fracture with routine healing: Secondary | ICD-10-CM

## 2024-08-18 NOTE — Patient Instructions (Signed)
Instructions ° °1.  You have sustained an ankle sprain, or similar exercises that can be treated as an ankle sprain.  **These exercises can also be used as part of recovery from an ankle fracture.  °2.  I encourage you to stay on your feet and gradually remove your walking boot.   °3.  Below are some exercises that you can complete on your own to improve your symptoms.  °4.  As an alternative, you can search for ankle sprain exercises online, and can see some demonstrations on YouTube  °5.  If you are having difficulty with these exercises, we can also prescribe formal physical therapy ° °Ankle Exercises °Ask your health care provider which exercises are safe for you. Do exercises exactly as told by your health care provider and adjust them as directed. It is normal to feel mild stretching, pulling, tightness, or mild discomfort as you do these exercises. Stop right away if you feel sudden pain or your pain gets worse. Do not begin these exercises until told by your health care provider. ° °Stretching and range-of-motion exercises °These exercises warm up your muscles and joints and improve the movement and flexibility of your ankle. These exercises may also help to relieve pain. ° °Dorsiflexion/plantar flexion ° °Sit with your R knee straight or bent. Do not rest your foot on anything. °Flex your left ankle to tilt the top of your foot toward your shin. This is called dorsiflexion. °Hold this position for 5 seconds. °Point your toes downward to tilt the top of your foot away from your shin. This is called plantar flexion. °Hold this position for 5 seconds. °Repeat 10 times. Complete this exercise 2-3 times a day.  As tolerated ° °Ankle alphabet ° °Sit with your R foot supported at your lower leg. °Do not rest your foot on anything. °Make sure your foot has room to move freely. °Think of your R foot as a paintbrush: °Move your foot to trace each letter of the alphabet in the air. Keep your hip and knee still while  you trace the letters. Trace every letter from A to Z. °Make the letters as large as you can without causing or increasing any discomfort. ° °Repeat 2-3 times. Complete this exercise 2-3 times a day. ° ° °Strengthening exercises °These exercises build strength and endurance in your ankle. Endurance is the ability to use your muscles for a long time, even after they get tired. °Dorsiflexors °These are muscles that lift your foot up. °Secure a rubber exercise band or tube to an object, such as a table leg, that will stay still when the band is pulled. Secure the other end around your R foot. °Sit on the floor, facing the object with your R leg extended. The band or tube should be slightly tense when your foot is relaxed. °Slowly flex your R ankle and toes to bring your foot toward your shin. °Hold this position for 5 seconds. °Slowly return your foot to the starting position, controlling the band as you do that. °Repeat 10 times. Complete this exercise 2-3 times a day. ° °Plantar flexors °These are muscles that push your foot down. °Sit on the floor with your R leg extended. °Loop a rubber exercise band or tube around the ball of your R foot. The ball of your foot is on the walking surface, right under your toes. The band or tube should be slightly tense when your foot is relaxed. °Slowly point your toes downward, pushing them away from   you. °Hold this position for 5 seconds. °Slowly release the tension in the band or tube, controlling smoothly until your foot is back in the starting position. °Repeat 10 times. Complete this exercise 2-3 times a day. ° °Towel curls ° °Sit in a chair on a non-carpeted surface, and put your feet on the floor. °Place a towel in front of your feet. °Keeping your heel on the floor, put your R foot on the towel. °Pull the towel toward you by grabbing the towel with your toes and curling them under. Keep your heel on the floor. °Let your toes relax. °Grab the towel again. Keep pulling the  towel until it is completely underneath your foot. °Repeat 10 times. Complete this exercise 2-3 times a day. ° °Standing plantar flexion °This is an exercise in which you use your toes to lift your body's weight while standing. °Stand with your feet shoulder-width apart. °Keep your weight spread evenly over the width of your feet while you rise up on your toes. Use a wall or table to steady yourself if needed, but try not to use it for support. °If this exercise is too easy, try these options: °Shift your weight toward your R leg until you feel challenged. °If told by your health care provider, lift your uninjured leg off the floor. °Hold this position for 5 seconds. °Repeat 10 times. Complete this exercise 2-3 times a day. ° °Tandem walking °Stand with one foot directly in front of the other. °Slowly raise your back foot up, lifting your heel before your toes, and place it directly in front of your other foot. °Continue to walk in this heel-to-toe way. Have a countertop or wall nearby to use if needed to keep your balance, but try not to hold onto anything for support. ° °Repeat 10 times. Complete this exercise 2-3 times a day. ° ° °Document Revised: 06/27/2018 Document Reviewed: 06/29/2018 °Elsevier Patient Education © 2020 [employer] ° °

## 2024-08-19 ENCOUNTER — Encounter: Payer: Self-pay | Admitting: Orthopedic Surgery

## 2024-08-19 NOTE — Progress Notes (Signed)
 Return patient Visit  Assessment: Travis Griffin is a 23 y.o. male with the following: 1. Closed nondisplaced fracture of fifth metatarsal bone of right foot, subsequent encounter  Plan: Travis Griffin sustained a right fifth metatarsal fracture, approximately 6 weeks ago.  Radiographs demonstrate interval consolidation of the fracture site.  He has minimal to no tenderness in line with the fifth metatarsal.  He is not complaining of pain.  He has already started walking without assistive device.  Given the improvements in his symptoms, as well as the changes on x-ray, I think it is okay for him to transition to a regular shoe, and weight-bear as tolerated.  I would like to closely monitor his improvements.  I would like to see him back in 2 weeks.  He states understanding.  Nothing else needed at this time.   Follow-up: Return in about 2 weeks (around 09/01/2024).  Subjective:  Chief Complaint  Patient presents with   Routine Post Op    Right Foot DOI 07/06/24    History of Present Illness: Travis Griffin is a 23 y.o. male who returns for evaluation of right foot pain.  He rolled his right ankle, and impacted the lateral aspect of the right foot 6 weeks ago.  He sustained a minimally displaced fracture of the right fifth metatarsal.  He was casted for a month.  Over the past couple of weeks he has been in a walking boot, and states he has been walking without assistive device.  He has no pain.  He is not taking any medications.  Review of Systems: No fevers or chills No numbness or tingling No chest pain No shortness of breath No bowel or bladder dysfunction No GI distress No headaches      Objective: There were no vitals taken for this visit.  Physical Exam:  General: Alert and oriented. and No acute distress. Gait: Ambulates with the assistance of crutches  Right foot without swelling.  No bruising.  Mild tenderness to palpation in line with the fifth  metatarsal.  Toes are warm and well-perfused.  Sensation intact to the dorsum of the foot.   IMAGING: I personally ordered and reviewed the following images  X-rays of the right foot were obtained in clinic today.  These are nonweightbearing views.  In comparison to available x-rays today, there has been no change in overall alignment.  There has been some consolidation at the fracture site.  No interval displacement.  No additional injuries noted.  No bony lesions.  Impression: Stable right fifth metatarsal fracture   New Medications:  No orders of the defined types were placed in this encounter.     Travis DELENA Horde, MD  08/19/2024 4:35 PM

## 2024-08-31 ENCOUNTER — Encounter: Admitting: Orthopedic Surgery

## 2024-10-04 NOTE — Telephone Encounter (Signed)
 Pharmacy Patient Advocate Encounter  Received notification from [employer] that Prior Authorization for Jatenzo  237MG  capsules  has been APPROVED from 08/10/2024 to 08/10/2025   PA #/Case ID/Reference #: [id number]

## 2025-02-07 ENCOUNTER — Ambulatory Visit: Admitting: Internal Medicine
